# Patient Record
Sex: Female | Born: 1980 | Race: White | Hispanic: No | Smoking: Current some day smoker
Health system: Southern US, Community
[De-identification: ages and names within clinical notes are randomized; demographics above are authoritative.]

## PROBLEM LIST (undated history)

## (undated) ENCOUNTER — Ambulatory Visit: Admission: EM | Payer: BC Managed Care – PPO | Source: Home / Self Care

## (undated) DIAGNOSIS — L509 Urticaria, unspecified: Secondary | ICD-10-CM

## (undated) DIAGNOSIS — J45909 Unspecified asthma, uncomplicated: Secondary | ICD-10-CM

## (undated) DIAGNOSIS — R102 Pelvic and perineal pain: Secondary | ICD-10-CM

## (undated) DIAGNOSIS — Z8742 Personal history of other diseases of the female genital tract: Secondary | ICD-10-CM

## (undated) DIAGNOSIS — Z8489 Family history of other specified conditions: Secondary | ICD-10-CM

## (undated) DIAGNOSIS — D649 Anemia, unspecified: Secondary | ICD-10-CM

## (undated) DIAGNOSIS — E119 Type 2 diabetes mellitus without complications: Secondary | ICD-10-CM

## (undated) DIAGNOSIS — I10 Essential (primary) hypertension: Secondary | ICD-10-CM

## (undated) HISTORY — PX: CARPAL TUNNEL RELEASE: SHX101

---

## 2000-02-25 ENCOUNTER — Encounter: Admission: RE | Admit: 2000-02-25 | Discharge: 2000-02-25 | Payer: Self-pay | Admitting: Neurosurgery

## 2000-02-25 ENCOUNTER — Encounter: Payer: Self-pay | Admitting: Neurosurgery

## 2001-09-02 ENCOUNTER — Inpatient Hospital Stay (HOSPITAL_COMMUNITY): Admission: EM | Admit: 2001-09-02 | Discharge: 2001-09-06 | Payer: Self-pay | Admitting: Psychiatry

## 2002-09-20 ENCOUNTER — Other Ambulatory Visit: Admission: RE | Admit: 2002-09-20 | Discharge: 2002-09-20 | Payer: Self-pay | Admitting: Obstetrics & Gynecology

## 2002-11-26 ENCOUNTER — Inpatient Hospital Stay (HOSPITAL_COMMUNITY): Admission: AD | Admit: 2002-11-26 | Discharge: 2002-11-26 | Payer: Self-pay | Admitting: Obstetrics and Gynecology

## 2003-01-09 ENCOUNTER — Ambulatory Visit (HOSPITAL_COMMUNITY): Admission: RE | Admit: 2003-01-09 | Discharge: 2003-01-09 | Payer: Self-pay | Admitting: Obstetrics & Gynecology

## 2003-03-26 ENCOUNTER — Inpatient Hospital Stay (HOSPITAL_COMMUNITY): Admission: AD | Admit: 2003-03-26 | Discharge: 2003-03-28 | Payer: Self-pay | Admitting: Obstetrics & Gynecology

## 2003-05-01 ENCOUNTER — Other Ambulatory Visit: Admission: RE | Admit: 2003-05-01 | Discharge: 2003-05-01 | Payer: Self-pay | Admitting: Family Medicine

## 2004-04-30 ENCOUNTER — Other Ambulatory Visit: Admission: RE | Admit: 2004-04-30 | Discharge: 2004-04-30 | Payer: Self-pay | Admitting: Gynecology

## 2004-06-08 ENCOUNTER — Emergency Department (HOSPITAL_COMMUNITY): Admission: EM | Admit: 2004-06-08 | Discharge: 2004-06-08 | Payer: Self-pay | Admitting: Emergency Medicine

## 2008-03-16 HISTORY — PX: TUBAL LIGATION: SHX77

## 2010-10-09 NOTE — Consult Note (Signed)
Brianna Berger, Brianna Berger               ACCOUNT NO.:  [id number]  MEDICAL RECORD NO.:  [id number]  LOCATION:                                 FACILITY:  PHYSICIAN:  Barbaraann Barthel, M.D. DATE OF BIRTH:  01/16/1981  DATE OF CONSULTATION: DATE OF DISCHARGE:                                CONSULTATION   This patient was self referred.  She had been seen in [facility] in the emergency room for abdominal pain and was diagnosed there as having gallstones.  She has family living in [place] and she was self- referred to my office.  She was worked up in the emergency room at which time she was noted to have on sonogram multiple stones on the sonography and a CT scan of the abdomen which did not reveal any other findings. Her liver function studies were within normal limits.  At that time, her BUN and creatinine were also not elevated.  Her glucose was normal.  Her white count was 8.5 with an H and H of 12.6 and 36.4, normal platelet count, and a normal differential.  She was being treated afterwards for urinary tract infection as well.  CHIEF COMPLAINT:  The patient has had right upper quadrant pain.  It has been postprandial in nature and radiating to her back accompanied with nausea and at times with vomiting.  She has had multiple episodes of this, particularly after the birth of her last child 2 years ago, but she has had pain and this is ongoing for 2-1/2 to 3 years.  PAST MEDICAL HISTORY:  Otherwise noncontributory.  She is morbidly obese for her size.  PAST SURGICAL HISTORY:  In 2010 has been a tubal ligation.  No other past surgery other than tubal ligation.  She has no known allergies.  MEDICATIONS: 1. Promethazine 25 mg as needed for nausea. 2. Cipro 500 mg b.i.d. for her urinary tract infection. 3. Oxycodone and acetaminophen 7.5/325 mg as needed for pain.  She is a nondrinker and nonsmoker.  PHYSICAL EXAMINATION:  VITAL SIGNS:  She is 5 feet 3 inches, weighs 225 pounds,  her temperature is 98.5, her pulse rate is 96, respirations 12, and blood pressure 120/78. HEENT:  Head is normocephalic.  Eyes:  Extraocular movements are intact. Pupils were round and react to light and accommodation.  Nose and oral mucosa are moist. NECK:  Short without any jugular vein distention, thyromegaly, tracheal deviation, or the presence of any bruits or adenopathy. CHEST:  Heart is regular rhythm without murmurs. LUNGS:  Clear. BREASTS:  Pendulous without masses in either breasts or nipple discharge or axillary adenopathy. ABDOMEN:  The patient is tender on deep palpation in right upper quadrant.  No guarding, no rebound at present.  No CV tenderness noted. Bowel sounds are normoactive.  No femoral or inguinal hernias appreciated.  The patient is presently having her menses, a rectal examination was thus deferred. PELVIC:  Deferred. EXTREMITIES:  Within normal limits.  REVIEW OF SYSTEMS:  NEUROLOGIC:  No history of migraines or seizures. ENDOCRINE:  No history of diabetes or thyroid disease.  CARDIOPULMONARY: Negative.  MUSCULOSKELETAL:  Obesity.  OB/GYN:  She is a gravida 4, para 3, cesarean  0, abortus 1.  The patient has had a tubal ligation in 2010 and no family history of carcinoma of the breast.  GI:  Right upper quadrant pain radiating to her back with nausea and vomiting episodically over the last 2-1/2 to 3 years.  No history of constipation, diarrhea, bright red rectal bleeding, black tarry stools, history of inflammatory bowel disease, or unexplained weight loss.  She has never had a colonoscopy.  GU:  Presently being treated for urinary tract infection.  She has had no past history of kidney stones.  IMPRESSION: 1. Cholecystitis, cholelithiasis. 2. Obesity. 3. Resolving urinary tract infection.  PLAN:  This patient will be admitted via the outpatient department. Antibiotics will be given perioperatively and we will plan on surgery for her via the  outpatient department on this coming Monday which is July 30.     Barbaraann Barthel, M.D.     WB/MEDQ  D:  10/08/2010  T:  10/09/2010  Job:  [id number]  cc:   Outpatient Surgery Department [facility]

## 2010-10-10 ENCOUNTER — Other Ambulatory Visit (HOSPITAL_COMMUNITY): Payer: Self-pay

## 2010-10-14 ENCOUNTER — Encounter (HOSPITAL_COMMUNITY)
Admission: RE | Admit: 2010-10-14 | Discharge: 2010-10-14 | Disposition: A | Discharge status: Home / Self Care | Payer: Medicaid Other | Source: Ambulatory Visit | Attending: General Surgery | Admitting: General Surgery

## 2010-10-14 ENCOUNTER — Encounter (HOSPITAL_COMMUNITY): Payer: Self-pay

## 2010-10-14 HISTORY — DX: Urticaria, unspecified: L50.9

## 2010-10-14 LAB — SURGICAL PCR SCREEN: Staphylococcus aureus: NEGATIVE

## 2010-10-14 MED ORDER — LACTATED RINGERS IV SOLN: INTRAVENOUS | Status: DC

## 2010-10-14 NOTE — Patient Instructions (Addendum)
20 Brianna Berger  10/14/2010   Your procedure is scheduled on:  Monday, 10/20/10  Report to [facility] at 08:45 AM.  Call this number if you have problems the morning of surgery: [phone]   Remember:   Do not eat food:After Midnight.  Do not drink clear liquids: After Midnight.  Take these medicines the morning of surgery with A SIP OF WATER: phenergan and percocet if needed   Do not wear jewelry, make-up or nail polish.  Do not wear lotions, powders, or perfumes. You may wear deodorant.  Do not shave 48 hours prior to surgery.  Do not bring valuables to the hospital.  Contacts, dentures or bridgework may not be worn into surgery.  Leave suitcase in the car. After surgery it may be brought to your room.  For patients admitted to the hospital, checkout time is 11:00 AM the day of discharge.   Patients discharged the day of surgery will not be allowed to drive home.  Name and phone number of your driver: driver  Special Instructions: CHG Shower Use Special Wash: 1/2 bottle night before surgery and 1/2 bottle morning of surgery.   Please read over the following fact sheets that you were given: Pain Booklet, Coughing and Deep Breathing, MRSA Information, Surgical Site Infection Prevention, Anesthesia Post-op Instructions and Care and Recovery After Surgery   PATIENT INSTRUCTIONS POST-ANESTHESIA  IMMEDIATELY FOLLOWING SURGERY:  Do not drive or operate machinery for the first twenty four hours after surgery.  Do not make any important decisions for twenty four hours after surgery or while taking narcotic pain medications or sedatives.  If you develop intractable nausea and vomiting or a severe headache please notify your doctor immediately.  FOLLOW-UP:  Please make an appointment with your surgeon as instructed. You do not need to follow up with anesthesia unless specifically instructed to do so.  WOUND CARE INSTRUCTIONS (if applicable):  Keep a dry clean dressing on the  anesthesia/puncture wound site if there is drainage.  Once the wound has quit draining you may leave it open to air.  Generally you should leave the bandage intact for twenty four hours unless there is drainage.  If the epidural site drains for more than 36-48 hours please call the anesthesia department.  QUESTIONS?:  Please feel free to call your physician or the hospital operator if you have any questions, and they will be happy to assist you.     General Instructions for Surgery These instructions are generic. They cover multiple surgeries and are a general pre-surgical guideline. They do not apply to all procedures. You may have questions. Answers will be provided by your caregiver. PREPARING FOR SURGERY  Stop smoking at least two weeks prior to surgery. This lowers risk during surgery. Ask your caregiver for help with this if needed. The benefits are well worth it. This is a good time for a healthy lifestyle change.   Your caregiver may advise that you stop taking certain medications that may affect the outcome of the surgery and your ability to heal. For example, you may need to stop taking anti-inflammatories, such as aspirin or ibuprofen because of possible bleeding problems. Other medications may have interactions with anesthesia.   BE SURE TO LET YOUR CAREGIVER KNOW IF YOU HAVE BEEN ON STEROIDS FOR LONG PERIODS OF TIME. THIS IS CRITICAL.   Your caregiver will discuss possible risks and complications with you before surgery. In addition to the usual risks of anesthesia, other common risks and complications include  blood loss and replacement (not applicable to minor surgical procedures), temporary increase in pain due to surgery, uncorrected pain or problems the surgery was meant to correct, infection, or new damage.  BEFORE SURGERY Arrive  prior to your procedure or as your caregiver suggests. Check in at the admissions desk to fill out necessary forms if you are not pre-registered. There  will be consent forms to sign prior to the procedure. There is a waiting area for your family while you are having your procedure.  LET YOUR CAREGIVERS KNOW ABOUT THE FOLLOWING:  Allergies.  Medications taken including herbs, eye drops, over the counter medications, and creams.   Use of steroids (by mouth or creams).   Previous problems with anesthetics or novocaine.   Possibility of pregnancy, if this applies.  History of blood clots (thrombophlebitis).   History of bleeding or blood problems.   Previous surgery.   Other health problems.   FOLLOWING SURGERY You will be taken to the recovery area where a nurse will watch and check your progress. Once you are awake, stable, and taking fluids well, barring other problems you will be allowed to go home. Once home, an ice pack wrapped in a light towel applied to your operative site may help with discomfort and keep the swelling down. Follow instructions as suggested by your caregiver. HOME CARE INSTRUCTIONS  Follow your caregiver's instructions as to activities, exercises, physical therapy, or driving a car.   Weight reduction may be helpful depending upon the type of surgery.   Daily exercise is helpful. Maintain strength and range of motion as instructed.   Only take over-the-counter or prescription medicines for pain, discomfort, or fever as directed by your caregiver.  SEEK MEDICAL CARE IF:  You notice increased bleeding (more than a small spot) from the wound.   You soak more than four pads following a uterine procedure unless instructed otherwise.   There is redness, swelling, or increasing pain in the wound.   You notice pus coming from wound.   An unexplained oral temperature over 101.4 develops, or as your caregiver suggests.   You notice a foul smell coming from the wound or dressing.   You become light headed or if you pass out (faint).  SEEK IMMEDIATE MEDICAL CARE IF:  You develop a rash.   You have difficulty  breathing.   You develop any allergic problems.  Document Released: 06/08/2000 Document Re-Released: 05/29/2008 [facility] Patient Information 2011 [place], [place].

## 2010-10-14 NOTE — Pre-Procedure Instructions (Signed)
Lab work from [facility] ED reviewed by Dr. Jayme Cloud. No new blood work needed preop. Urine HCG needed in preop.

## 2010-10-20 ENCOUNTER — Ambulatory Visit (HOSPITAL_COMMUNITY)
Admission: RE | Admit: 2010-10-20 | Discharge: 2010-10-21 | Disposition: A | Discharge status: Home / Self Care | Payer: Medicaid Other | Source: Ambulatory Visit | Attending: General Surgery | Admitting: General Surgery

## 2010-10-20 ENCOUNTER — Encounter (HOSPITAL_COMMUNITY): Payer: Self-pay | Admitting: Anesthesiology

## 2010-10-20 ENCOUNTER — Other Ambulatory Visit: Payer: Self-pay | Admitting: General Surgery

## 2010-10-20 ENCOUNTER — Encounter (HOSPITAL_COMMUNITY)
Admission: RE | Disposition: A | Discharge status: Home / Self Care | Payer: Self-pay | Source: Ambulatory Visit | Attending: General Surgery

## 2010-10-20 ENCOUNTER — Ambulatory Visit (HOSPITAL_COMMUNITY): Payer: Medicaid Other | Admitting: Anesthesiology

## 2010-10-20 ENCOUNTER — Encounter (HOSPITAL_COMMUNITY): Payer: Self-pay

## 2010-10-20 DIAGNOSIS — K8010 Calculus of gallbladder with chronic cholecystitis without obstruction: Secondary | ICD-10-CM | POA: Insufficient documentation

## 2010-10-20 DIAGNOSIS — Z01812 Encounter for preprocedural laboratory examination: Secondary | ICD-10-CM | POA: Insufficient documentation

## 2010-10-20 HISTORY — PX: CHOLECYSTECTOMY: SHX55

## 2010-10-20 SURGERY — LAPAROSCOPIC CHOLECYSTECTOMY
Anesthesia: General | Wound class: Clean Contaminated

## 2010-10-20 MED ORDER — ONDANSETRON HCL 4 MG/2ML IJ SOLN
INTRAMUSCULAR | Status: AC
  Administered 2010-10-20: 4 mg via INTRAVENOUS
  Filled 2010-10-20: qty 2

## 2010-10-20 MED ORDER — ONDANSETRON HCL 4 MG/2ML IJ SOLN
4.0000 mg | Freq: Four times a day (QID) | INTRAMUSCULAR | Status: DC | PRN
  Administered 2010-10-20: 4 mg via INTRAVENOUS
  Filled 2010-10-20: qty 2

## 2010-10-20 MED ORDER — LACTATED RINGERS IV SOLN
INTRAVENOUS | Status: DC
  Administered 2010-10-20 (×3): via INTRAVENOUS

## 2010-10-20 MED ORDER — FENTANYL CITRATE 0.05 MG/ML IJ SOLN
25.0000 ug | INTRAMUSCULAR | Status: DC | PRN
  Administered 2010-10-20 (×2): 50 ug via INTRAVENOUS

## 2010-10-20 MED ORDER — GLYCOPYRROLATE 0.2 MG/ML IJ SOLN
0.2000 mg | Freq: Once | INTRAMUSCULAR | Status: AC
  Administered 2010-10-20: 0.2 mg via INTRAVENOUS

## 2010-10-20 MED ORDER — BUPIVACAINE HCL (PF) 0.5 % IJ SOLN
INTRAMUSCULAR | Status: DC | PRN
  Administered 2010-10-20: 10 mL

## 2010-10-20 MED ORDER — ACETAMINOPHEN 325 MG PO TABS: 650.0000 mg | ORAL_TABLET | ORAL | Status: DC | PRN

## 2010-10-20 MED ORDER — NEOSTIGMINE METHYLSULFATE 1 MG/ML IJ SOLN
INTRAMUSCULAR | Status: DC | PRN
  Administered 2010-10-20: 2 mg via INTRAMUSCULAR

## 2010-10-20 MED ORDER — ROCURONIUM BROMIDE 100 MG/10ML IV SOLN
INTRAVENOUS | Status: DC | PRN
  Administered 2010-10-20: 35 mg via INTRAVENOUS
  Administered 2010-10-20: 5 mg via INTRAVENOUS

## 2010-10-20 MED ORDER — ROCURONIUM BROMIDE 50 MG/5ML IV SOLN
INTRAVENOUS | Status: AC
  Filled 2010-10-20: qty 1

## 2010-10-20 MED ORDER — GLYCOPYRROLATE 0.2 MG/ML IJ SOLN
INTRAMUSCULAR | Status: AC
  Filled 2010-10-20: qty 1

## 2010-10-20 MED ORDER — LIDOCAINE HCL (PF) 1 % IJ SOLN
INTRAMUSCULAR | Status: AC
  Filled 2010-10-20: qty 5

## 2010-10-20 MED ORDER — NEOSTIGMINE METHYLSULFATE 1 MG/ML IJ SOLN
INTRAMUSCULAR | Status: AC
  Filled 2010-10-20: qty 10

## 2010-10-20 MED ORDER — WATER FOR IRRIGATION, STERILE IR SOLN
Status: DC | PRN
  Administered 2010-10-20: 2000 mL

## 2010-10-20 MED ORDER — MORPHINE SULFATE 2 MG/ML IJ SOLN
1.0000 mg | INTRAMUSCULAR | Status: DC | PRN
  Administered 2010-10-20 – 2010-10-21 (×5): 1 mg via INTRAVENOUS
  Filled 2010-10-20 (×5): qty 1

## 2010-10-20 MED ORDER — ONDANSETRON HCL 4 MG/2ML IJ SOLN
4.0000 mg | Freq: Once | INTRAMUSCULAR | Status: AC
  Administered 2010-10-20: 4 mg via INTRAVENOUS

## 2010-10-20 MED ORDER — POTASSIUM CHLORIDE IN NACL 20-0.9 MEQ/L-% IV SOLN
INTRAVENOUS | Status: DC
  Administered 2010-10-20 – 2010-10-21 (×3): via INTRAVENOUS

## 2010-10-20 MED ORDER — HEMOSTATIC AGENTS (NO CHARGE) OPTIME
TOPICAL | Status: DC | PRN
  Administered 2010-10-20: 1 via TOPICAL

## 2010-10-20 MED ORDER — LORATADINE 10 MG PO TABS
10.0000 mg | ORAL_TABLET | Freq: Every day | ORAL | Status: DC
  Administered 2010-10-21: 10 mg via ORAL
  Filled 2010-10-20: qty 1

## 2010-10-20 MED ORDER — BUPIVACAINE HCL (PF) 0.5 % IJ SOLN
INTRAMUSCULAR | Status: AC
  Filled 2010-10-20: qty 30

## 2010-10-20 MED ORDER — FENTANYL CITRATE 0.05 MG/ML IJ SOLN
INTRAMUSCULAR | Status: AC
  Administered 2010-10-20: 50 ug via INTRAVENOUS
  Filled 2010-10-20: qty 2

## 2010-10-20 MED ORDER — NON FORMULARY: 10.0000 mg | Status: DC

## 2010-10-20 MED ORDER — DEXTROSE 5 % IV SOLN
INTRAVENOUS | Status: AC
  Filled 2010-10-20: qty 1

## 2010-10-20 MED ORDER — NALOXONE HCL 0.4 MG/ML IJ SOLN
INTRAMUSCULAR | Status: AC
  Filled 2010-10-20: qty 1

## 2010-10-20 MED ORDER — SODIUM CHLORIDE 0.9 % IV SOLN
INTRAVENOUS | Status: DC
  Filled 2010-10-20 (×6): qty 1000

## 2010-10-20 MED ORDER — FENTANYL CITRATE 0.05 MG/ML IJ SOLN
INTRAMUSCULAR | Status: DC | PRN
  Administered 2010-10-20 (×5): 50 ug via INTRAVENOUS

## 2010-10-20 MED ORDER — DEXTROSE 5 % IV SOLN
1.0000 g | Freq: Once | INTRAVENOUS | Status: AC
  Administered 2010-10-20: 1 g via INTRAVENOUS

## 2010-10-20 MED ORDER — GLYCOPYRROLATE 0.2 MG/ML IJ SOLN
INTRAMUSCULAR | Status: AC
  Administered 2010-10-20: 0.2 mg via INTRAVENOUS
  Filled 2010-10-20: qty 1

## 2010-10-20 MED ORDER — ONDANSETRON HCL 4 MG/2ML IJ SOLN
4.0000 mg | Freq: Once | INTRAMUSCULAR | Status: AC | PRN
  Administered 2010-10-20: 4 mg via INTRAVENOUS

## 2010-10-20 MED ORDER — GLYCOPYRROLATE 0.2 MG/ML IJ SOLN
INTRAMUSCULAR | Status: DC | PRN
  Administered 2010-10-20: .2 mg via INTRAVENOUS
  Administered 2010-10-20: .4 mg via INTRAVENOUS

## 2010-10-20 MED ORDER — PROPOFOL 10 MG/ML IV EMUL
INTRAVENOUS | Status: AC
  Filled 2010-10-20: qty 20

## 2010-10-20 MED ORDER — FENTANYL CITRATE 0.05 MG/ML IJ SOLN
INTRAMUSCULAR | Status: AC
  Administered 2010-10-20: 50 ug via INTRAVENOUS
  Filled 2010-10-20: qty 5

## 2010-10-20 MED ORDER — MIDAZOLAM HCL 2 MG/2ML IJ SOLN
1.0000 mg | INTRAMUSCULAR | Status: DC | PRN
  Administered 2010-10-20: 2 mg via INTRAVENOUS

## 2010-10-20 MED ORDER — PROPOFOL 10 MG/ML IV EMUL
INTRAVENOUS | Status: DC | PRN
  Administered 2010-10-20: 150 mg via INTRAVENOUS
  Administered 2010-10-20: 50 mg via INTRAVENOUS

## 2010-10-20 MED ORDER — LORAZEPAM 1 MG PO TABS
1.0000 mg | ORAL_TABLET | Freq: Every day | ORAL | Status: DC
  Administered 2010-10-20: 1 mg via ORAL
  Filled 2010-10-20: qty 1

## 2010-10-20 MED ORDER — SODIUM CHLORIDE 0.9 % IR SOLN
Status: DC | PRN
  Administered 2010-10-20: 1000 mL

## 2010-10-20 MED ORDER — MIDAZOLAM HCL 2 MG/2ML IJ SOLN
INTRAMUSCULAR | Status: AC
  Administered 2010-10-20: 2 mg via INTRAVENOUS
  Filled 2010-10-20: qty 2

## 2010-10-20 MED ORDER — LACTATED RINGERS IV SOLN
INTRAVENOUS | Status: AC
  Filled 2010-10-20: qty 1000

## 2010-10-20 MED ORDER — SODIUM CHLORIDE 0.9 % IR SOLN
Status: DC | PRN
  Administered 2010-10-20: 3000 mL

## 2010-10-20 MED FILL — Water For Irrigation, Sterile Irrigation Soln: Qty: 2000 | Status: AC

## 2010-10-20 SURGICAL SUPPLY — 69 items
APPLICATOR COTTON TIP 6IN STRL (MISCELLANEOUS) ×2 IMPLANT
APPLIER CLIP ROT 10 11.4 M/L (STAPLE) ×2
APR CLP MED LRG 11.4X10 (STAPLE) ×1
ATTRACTOMAT 16X20 MAGNETIC DRP (DRAPES) IMPLANT
BAG HAMPER (MISCELLANEOUS) ×2 IMPLANT
BAG SPEC RTRVL LRG 6X4 10 (ENDOMECHANICALS) ×1
BLADE SURG 15 STRL LF DISP TIS (BLADE) ×1 IMPLANT
BLADE SURG 15 STRL SS (BLADE) ×2
BLADE SURG SZ10 CARB STEEL (BLADE) IMPLANT
CLIP APPLIE ROT 10 11.4 M/L (STAPLE) ×1 IMPLANT
CLOTH BEACON ORANGE TIMEOUT ST (SAFETY) ×2 IMPLANT
COVER LIGHT HANDLE STERIS (MISCELLANEOUS) ×5 IMPLANT
DECANTER SPIKE VIAL GLASS SM (MISCELLANEOUS) ×2 IMPLANT
DISSECTOR BLUNT TIP ENDO 5MM (MISCELLANEOUS) ×3 IMPLANT
DRAPE WARM FLUID 44X44 (DRAPE) IMPLANT
DRSG TEGADERM 2-3/8X2-3/4 SM (GAUZE/BANDAGES/DRESSINGS) ×6 IMPLANT
DRSG TEGADERM 4X4.75 (GAUZE/BANDAGES/DRESSINGS) ×2 IMPLANT
ELECT BLADE 6 FLAT ULTRCLN (ELECTRODE) IMPLANT
ELECT REM PT RETURN 9FT ADLT (ELECTROSURGICAL) ×2
ELECTRODE REM PT RTRN 9FT ADLT (ELECTROSURGICAL) ×1 IMPLANT
EVACUATOR DRAINAGE 10X20 100CC (DRAIN) ×1 IMPLANT
EVACUATOR SILICONE 100CC (DRAIN) ×2
FILTER SMOKE EVAC LAPAROSHD (FILTER) ×2 IMPLANT
FORMALIN 10 PREFIL 120ML (MISCELLANEOUS) ×2 IMPLANT
GAUZE SPONGE 4X4 12PLY STRL LF (GAUZE/BANDAGES/DRESSINGS) ×1 IMPLANT
GLOVE BIOGEL PI IND STRL 7.0 (GLOVE) IMPLANT
GLOVE BIOGEL PI INDICATOR 7.0 (GLOVE) ×4
GLOVE ECLIPSE 6.5 STRL STRAW (GLOVE) ×2 IMPLANT
GLOVE ECLIPSE 7.0 STRL STRAW (GLOVE) ×1 IMPLANT
GLOVE SKINSENSE NS SZ7.0 (GLOVE) ×1
GLOVE SKINSENSE STRL SZ7.0 (GLOVE) ×1 IMPLANT
GOWN BRE IMP SLV AUR XL STRL (GOWN DISPOSABLE) ×4 IMPLANT
HEMOSTAT SNOW SURGICEL 2X4 (HEMOSTASIS) ×1 IMPLANT
HEMOSTAT SURGICEL 4X8 (HEMOSTASIS) ×2 IMPLANT
INST SET LAPROSCOPIC AP (KITS) ×2 IMPLANT
IV NS IRRIG 3000ML ARTHROMATIC (IV SOLUTION) ×2 IMPLANT
KIT ROOM TURNOVER APOR (KITS) ×2 IMPLANT
KIT TROCAR LAP CHOLE (TROCAR) ×2 IMPLANT
MANIFOLD NEPTUNE II (INSTRUMENTS) ×2 IMPLANT
MARKER SKIN DUAL TIP RULER LAB (MISCELLANEOUS) ×1 IMPLANT
NS IRRIG 1000ML POUR BTL (IV SOLUTION) ×2 IMPLANT
PACK LAP CHOLE LZT030E (CUSTOM PROCEDURE TRAY) ×2 IMPLANT
PAD ARMBOARD 7.5X6 YLW CONV (MISCELLANEOUS) ×2 IMPLANT
PENCIL HANDSWITCHING (ELECTRODE) IMPLANT
POUCH SPECIMEN RETRIEVAL 10MM (ENDOMECHANICALS) ×2 IMPLANT
SET BASIN LINEN APH (SET/KITS/TRAYS/PACK) ×2 IMPLANT
SET TUBE IRRIG SUCTION NO TIP (IRRIGATION / IRRIGATOR) ×2 IMPLANT
SOL PREP PROV IODINE SCRUB 4OZ (MISCELLANEOUS) ×2 IMPLANT
SPONGE DRAIN TRACH 4X4 STRL 2S (GAUZE/BANDAGES/DRESSINGS) ×2 IMPLANT
SPONGE GAUZE 4X4 12PLY (GAUZE/BANDAGES/DRESSINGS) ×2 IMPLANT
SPONGE INTESTINAL PEANUT (DISPOSABLE) IMPLANT
SPONGE LAP 18X18 X RAY DECT (DISPOSABLE) IMPLANT
STAPLER VISISTAT 35W (STAPLE) ×2 IMPLANT
SUT ETHILON 3 0 FSL (SUTURE) ×2 IMPLANT
SUT SILK 2 0 (SUTURE)
SUT SILK 2 0 SH (SUTURE) IMPLANT
SUT SILK 2-0 18XBRD TIE 12 (SUTURE) IMPLANT
SUT SILK 3 0 SH CR/8 (SUTURE) IMPLANT
SUT VIC AB 0 CT1 27 (SUTURE)
SUT VIC AB 0 CT1 27XBRD ANTBC (SUTURE) IMPLANT
SUT VIC AB 0 CT1 27XCR 8 STRN (SUTURE) IMPLANT
SUT VICRYL 0 UR6 27IN ABS (SUTURE) ×2 IMPLANT
SYR BULB IRRIGATION 50ML (SYRINGE) IMPLANT
TAPE CLOTH SURG 4X10 WHT LF (GAUZE/BANDAGES/DRESSINGS) ×1 IMPLANT
TOWEL OR 17X26 4PK STRL BLUE (TOWEL DISPOSABLE) ×2 IMPLANT
TRAY FOLEY CATH 14FR (SET/KITS/TRAYS/PACK) ×2 IMPLANT
WARMER LAPAROSCOPE (MISCELLANEOUS) ×2 IMPLANT
WATER STERILE IRR 1000ML POUR (IV SOLUTION) ×4 IMPLANT
YANKAUER SUCT BULB TIP 10FT TU (MISCELLANEOUS) IMPLANT

## 2010-10-20 NOTE — Anesthesia Postprocedure Evaluation (Signed)
  Anesthesia Post-op Note  Patient: Brianna Berger  Procedure(s) Performed:  LAPAROSCOPIC CHOLECYSTECTOMY  Patient Location: PACU  Anesthesia Type: General  Level of Consciousness: alert   Airway and Oxygen Therapy: Patient Spontanous Breathing  Post-op Pain: mild  Post-op Assessment: Patient's Cardiovascular Status Stable, Respiratory Function Stable, Patent Airway and No signs of Nausea or vomiting  Post-op Vital Signs: stable  Complications: No apparent anesthesia complications

## 2010-10-20 NOTE — Progress Notes (Signed)
  No change in H&P.  For surgery, 10/20/10.

## 2010-10-20 NOTE — Brief Op Note (Addendum)
10/20/2010  1:17 PM  PATIENT:  Brianna Berger  30 y.o. female  PRE-OPERATIVE DIAGNOSIS:  cholelithiasis,cholecystitis  POST-OPERATIVE DIAGNOSIS:  cholelithiasis, cholecystitis  PROCEDURE:  Procedure(s): LAPAROSCOPIC CHOLECYSTECTOMY  SURGEON:  Surgeon(s): Marlane Hatcher  PHYSICIAN ASSISTANT:   ASSISTANTS: none   ANESTHESIA:   general  ESTIMATED BLOOD LOSS: * No blood loss amount entered *   BLOOD ADMINISTERED:none  DRAINS: (liver bed.) Jackson-Pratt drain(s) with closed bulb suction in the liver bed   LOCAL MEDICATIONS USED:  MARCAINE 9CC  SPECIMEN:  No Specimen  DISPOSITION OF SPECIMEN:  N/A  COUNTS:  YES  TOURNIQUET:  * No tourniquets in log *  DICTATION #:xxx  PLAN OF CARE: obs status   PATIENT DISPOSITION:  PACU - hemodynamically stable.   Delay start of Pharmacological VTE agent (>24hrs) due to surgical blood loss or risk of bleeding:  {no               Specimen:  Gall bladder and stones Dictation #:  [id number]

## 2010-10-20 NOTE — Anesthesia Procedure Notes (Addendum)
Procedure Name: Intubation Date/Time: 10/20/2010 11:56 AM Performed by: Minerva Areola Pre-anesthesia Checklist: Patient identified, Patient being monitored, Timeout performed, Emergency Drugs available and Suction available Patient Re-evaluated:Patient Re-evaluated prior to inductionOxygen Delivery Method: Circle System Utilized Preoxygenation: Pre-oxygenation with 100% oxygen Intubation Type: IV induction Ventilation: Mask ventilation without difficulty Laryngoscope Size: Miller and 2 Grade View: Grade I Tube type: Oral Tube size: 7.0 mm Number of attempts: 1 Airway Equipment and Method: stylet Placement Confirmation: ETT inserted through vocal cords under direct vision,  positive ETCO2 and breath sounds checked- equal and bilateral Secured at: 21 cm Tube secured with: Tape Dental Injury: Teeth and Oropharynx as per pre-operative assessment

## 2010-10-20 NOTE — Transfer of Care (Signed)
Immediate Anesthesia Transfer of Care Note  Patient: Brianna Berger  Procedure(s) Performed:  LAPAROSCOPIC CHOLECYSTECTOMY  Patient Location: PACU  Anesthesia Type: General  Level of Consciousness: awake and patient cooperative  Airway & Oxygen Therapy: Patient Spontanous Breathing and non-rebreather face mask  Post-op Assessment: Report given to PACU RN, Post -op Vital signs reviewed and stable and Patient moving all extremities  Post vital signs: Reviewed and stable  Complications: No apparent anesthesia complications

## 2010-10-20 NOTE — Brief Op Note (Signed)
10/20/2010  1:02 PM  PATIENT:  Brianna Berger  30 y.o. female  PRE-OPERATIVE DIAGNOSIS:  cholelithiasis,cholecystitis  POST-OPERATIVE DIAGNOSIS:  cholelithiasis, cholecystitis  PROCEDURE:  Procedure(s): LAPAROSCOPIC CHOLECYSTECTOMY  SURGEON:  Surgeon(s): Marlane Hatcher  PHYSICIAN ASSISTANT:   ASSISTANTS: none   ANESTHESIA:   general  ESTIMATED BLOOD LOSS: * No blood loss amount entered *   BLOOD ADMINISTERED:none  DRAINS: () Jackson-Pratt drain(s) with closed bulb suction in the  liver bed   LOCAL MEDICATIONS USED:  OTHER marcaine  SPECIMEN:  Source of Specimen:  gall bladder and stones  DISPOSITION OF SPECIMEN:  PATHOLOGY  COUNTS:  YES  TOURNIQUET:  * No tourniquets in log *  DICTATION #: [id number]  PLAN OF CARE: obs  PATIENT DISPOSITION:  rm 316   Delay start of Pharmacological VTE agent (>24hrs) due to surgical blood loss or risk of bleeding:  no       Dict.#[id number]

## 2010-10-20 NOTE — Anesthesia Preprocedure Evaluation (Signed)
Anesthesia Evaluation  Name, MR# and DOB Patient awake  General Assessment Comment  Reviewed: Allergy & Precautions, H&P  and Patient's Chart, lab work & pertinent test results  History of Anesthesia Complications Negative for: history of anesthetic complications  Airway Mallampati: I  Neck ROM: Full    Dental  (+) Teeth Intact   Pulmonary    pulmonary exam normalPulmonary Exam Normal     Cardiovascular Regular     Neuro/Psych   GI/Hepatic/Renal   Endo/Other    Abdominal   Musculoskeletal   Hematology   Peds  Reproductive/Obstetrics    Anesthesia Other Findings             Anesthesia Physical Anesthesia Plan  ASA: I  Anesthesia Plan: General   Post-op Pain Management:    Induction: Intravenous  Airway Management Planned: Oral ETT  Additional Equipment:   Intra-op Plan:   Post-operative Plan:   Informed Consent: I have reviewed the patients History and Physical, chart, labs and discussed the procedure including the risks, benefits and alternatives for the proposed anesthesia with the patient or authorized representative who has indicated his/her understanding and acceptance.     Plan Discussed with:   Anesthesia Plan Comments:         Anesthesia Quick Evaluation

## 2010-10-21 LAB — HEPATIC FUNCTION PANEL
ALT: 34 U/L (ref 0–35)
Bilirubin, Direct: 0.1 mg/dL (ref 0.0–0.3)
Indirect Bilirubin: 0.3 mg/dL (ref 0.3–0.9)
Total Bilirubin: 0.4 mg/dL (ref 0.3–1.2)

## 2010-10-21 LAB — BASIC METABOLIC PANEL
BUN: 7 mg/dL (ref 6–23)
Chloride: 105 mEq/L (ref 96–112)
Creatinine, Ser: 0.6 mg/dL (ref 0.50–1.10)
GFR calc Af Amer: >60 mL/min (ref >60)
GFR calc non Af Amer: >60 mL/min (ref >60)

## 2010-10-21 LAB — CBC
HCT: 31.9 % — ABNORMAL LOW (ref 36.0–46.0)
MCHC: 33.5 g/dL (ref 30.0–36.0)
MCV: 90.1 fL (ref 78.0–100.0)
RDW: 13.4 % (ref 11.5–15.5)

## 2010-10-21 MED ORDER — ACETAMINOPHEN 325 MG PO TABS: 650.0000 mg | ORAL_TABLET | ORAL | Status: AC | PRN

## 2010-10-21 MED ORDER — OXYCODONE-ACETAMINOPHEN 7.5-325 MG PO TABS: 1.0000 | ORAL_TABLET | Freq: Four times a day (QID) | ORAL | Status: DC | PRN

## 2010-10-21 MED ORDER — POTASSIUM CHLORIDE IN NACL 20-0.9 MEQ/L-% IV SOLN
INTRAVENOUS | Status: DC
  Administered 2010-10-21: 12:00:00 via INTRAVENOUS

## 2010-10-21 NOTE — Op Note (Signed)
Brianna Berger, Brianna Berger               ACCOUNT NO.:  [id number]  MEDICAL RECORD NO.:  [id number]  LOCATION:  A316                          FACILITY:  [facility]  PHYSICIAN:  Barbaraann Barthel, M.D. DATE OF BIRTH:  14-Jun-1980  DATE OF PROCEDURE:  10/20/2010 DATE OF DISCHARGE:                              OPERATIVE REPORT   SURGEON:  Barbaraann Barthel, MD  PREOPERATIVE DIAGNOSIS:  Cholecystitis and cholelithiasis.  POSTOPERATIVE DIAGNOSIS:  Cholecystitis and cholelithiasis.  PROCEDURE:  Laparoscopic cholecystectomy.  SPECIMEN:  Gallbladder and stones.  WOUND CLASSIFICATION:  Clean, contaminated.  ESTIMATED BLOOD LOSS:  Minimal, less than 50 mL.  FLUIDS:  1400 mL of crystalloids intraoperatively.  No transfusions. One Jackson-Pratt drain in the liver bed.  Note, this is a 30 year old white female who had approximately 2-1/2-3- year history of nausea and vomiting with recurrent episodes of right upper quadrant pain.  She was worked up [employer] and found to have cholelithiasis.  She came to [facility] and self-referred to me for cholecystectomy.  We discussed the need for cholecystectomy, discussing complications not limited to but including bleeding, infection, damage to bile ducts, perforation of organs, transitory diarrhea and informed consent was obtained.  GROSS OPERATIVE FINDINGS:  The patient had a lot of intra-abdominal fat. There was small cystic duct, this was not cannulated.  There were stones within the gallbladder.  There were no other intraoperative findings.  TECHNIQUE:  The patient was placed in supine position and after the adequate administration of general anesthesia via endotracheal intubation, her entire abdomen was prepped with Betadine solution and draped in usual manner.  Prior to this, a Foley catheter was aseptically inserted.  An incision was carried out above the umbilicus transversely through the skin, subcutaneous tissue down to the fascia, which  was grasped with a sharp towel clip and elevated.  A Veress needle was inserted and confirmed the position with a saline drop test.  We then insufflated the abdomen with approximately 4.2 liters of CO2.  Then, using the Visiport technique, an 11-mm cannula was inserted and then under direct vision, another 11-mm cannula was placed in the epigastrium and then under direct vision two 5-mm cannula was placed in the right upper quadrant laterally.  The gallbladder was grasped.  Adhesions were taken down.  The cystic duct was clearly visualized, triply silver clipped and divided as was the cystic artery.  We then removed the gallbladder without spillage with the hook cautery device.  There was minimal oozing.  This was controlled with a cautery device and then after checking for bleeding and hemostasis, I elected to leave a Surgicel on the liver bed and the Jackson-Pratt drain in the liver bed, which exited through one of the lateral incision sites.  We then desufflated the abdomen and then I closed the larger incision sites in the epigastrium and the umbilicus with 0 Polysorb and used 0.5% Sensorcaine and all port sites were postoperatively comfort.  The wound was then closed with stapling device and the drain was sutured in place with 3-0 nylon.  Prior to closure, all sponge, needle, and instrument counts were found to be correct.  Estimated blood loss was minimal.  The patient tolerated the  procedure well and was taken to the recovery room in satisfactory condition.     Barbaraann Barthel, M.D.     WB/MEDQ  D:  10/20/2010  T:  10/21/2010  Job:  [id number]

## 2010-10-21 NOTE — Discharge Summary (Signed)
  Dic# [id number]

## 2010-10-21 NOTE — Anesthesia Postprocedure Evaluation (Signed)
  Anesthesia Post-op Note  Patient: Brianna Berger  Procedure(s) Performed:  LAPAROSCOPIC CHOLECYSTECTOMY  Patient Location: Room 316  Anesthesia Type: General  Level of Consciousness: awake, alert , oriented and patient cooperative  Airway and Oxygen Therapy: Patient Spontanous Breathing  Post-op Pain: 5 /10, moderate  Post-op Assessment: Post-op Vital signs reviewed, Patient's Cardiovascular Status Stable, Respiratory Function Stable, Patent Airway, No signs of Nausea or vomiting, Adequate PO intake and Pain level controlled  Post-op Vital Signs: stable  Complications: No apparent anesthesia complications

## 2010-10-21 NOTE — Progress Notes (Signed)
  POD#1:  Minimal discomfort, wound clean, dressing changed. Minimal serous JP drainage.  Have removed the drain.  No SOB, leg pain, or dysuria.  Labs reviewed, LFTs WNL.    CBC    Component Value Date/Time   WBC 8.7 10/21/2010 0507   RBC 3.54* 10/21/2010 0507   HGB 10.7* 10/21/2010 0507   HCT 31.9* 10/21/2010 0507   PLT 201 10/21/2010 0507   MCV 90.1 10/21/2010 0507   MCH 30.2 10/21/2010 0507   MCHC 33.5 10/21/2010 0507   RDW 13.4 10/21/2010 0507   Lab Results  Component Value Date   ALT 34 10/21/2010   AST 29 10/21/2010   ALKPHOS 54 10/21/2010   BILITOT 0.4 10/21/2010   Plan to discharge today.  Follow up arranged.

## 2010-10-21 NOTE — Progress Notes (Signed)
Pt d/c home via mother. Pt A&O x3, ambulatory, pain controlled and rated at 2/10 in her abdomen which pt states is her pain goal. Incision sites are clean, dry, and intact that were cleansed with alcohol recently by Dr. Malvin Johns. D/C instructions, d/c medications, f/u appts, and incision care were all reviewed with pt prior to pt. All questions were answered and pt verbalized understanding.

## 2010-10-22 NOTE — Discharge Summary (Signed)
[name], Brianna Berger               ACCOUNT NO.:  [id number]  MEDICAL RECORD NO.:  [id number]  LOCATION:  A316                          FACILITY:  [facility]  PHYSICIAN:  Barbaraann Barthel, M.D. DATE OF BIRTH:  10-14-1980  DATE OF ADMISSION:  10/20/2010 DATE OF DISCHARGE:  08/07/2012LH                              DISCHARGE SUMMARY   PROCEDURE:  Laparoscopic cholecystectomy on October 20, 2010.  DIAGNOSES:  Cholecystitis, cholelithiasis.  SECONDARY DIAGNOSIS:  Obesity.  NOTE:  This is a 30 year old white female who was admitted via the Outpatient Department for semielective laparoscopic cholecystectomy. She was seen previously for abdominal pain and had signs and symptoms of cholecystitis and cholelithiasis.  Her liver function studies were mildly elevated preoperatively.  However, postoperatively, the liver function studies were within the normal range.  She was admitted via the Outpatient Department for the procedure, which we had discussed in detail with her preoperatively and her hospital course was completely uneventful.  She underwent a laparoscopic cholecystectomy and she was discharged on the first postoperative day.  At that time, her wound was clean.  She was voiding well.  She had no dysuria, leg pain, or shortness of breath and was doing quite well.  Her Jackson-Pratt drain had minimal serosanguineous drainage and this was removed prior to her discharge.  Wound care was explained to her and she was told to follow up with [place] on the following Monday at 3 o'clock p.m. and she is to contact us should there be any acute changes.  Her discharge instructions were given and she was also ordered oxycodone/ Percocet 7.5/325 mg tablet 1 q.4-6 h. as needed for pain.  Again, she is told to contact us or go to the emergency room if there are problems.     Barbaraann Barthel, M.D.     WB/MEDQ  D:  10/21/2010  T:  10/22/2010  Job:  [id number]

## 2010-10-29 ENCOUNTER — Encounter (HOSPITAL_COMMUNITY): Payer: Self-pay | Admitting: General Surgery

## 2013-04-02 ENCOUNTER — Emergency Department (HOSPITAL_COMMUNITY)
Admission: EM | Admit: 2013-04-02 | Discharge: 2013-04-02 | Discharge status: Against Medical Advice | Payer: BC Managed Care – PPO | Attending: Emergency Medicine | Admitting: Emergency Medicine

## 2013-04-02 ENCOUNTER — Encounter (HOSPITAL_COMMUNITY): Payer: Self-pay | Admitting: Emergency Medicine

## 2013-04-02 DIAGNOSIS — R51 Headache: Secondary | ICD-10-CM | POA: Insufficient documentation

## 2013-04-02 DIAGNOSIS — F17200 Nicotine dependence, unspecified, uncomplicated: Secondary | ICD-10-CM | POA: Insufficient documentation

## 2013-04-02 DIAGNOSIS — R209 Unspecified disturbances of skin sensation: Secondary | ICD-10-CM | POA: Insufficient documentation

## 2013-04-02 DIAGNOSIS — R42 Dizziness and giddiness: Secondary | ICD-10-CM | POA: Insufficient documentation

## 2013-04-02 NOTE — ED Notes (Signed)
Pt states she is leaving.  Encouraged pt to stay again and she declines and states she is going home.

## 2013-04-02 NOTE — ED Notes (Signed)
Pt presents to department for evaluation of headache. States she was at home this morning and almost passed out. Also reports dizziness and numbness to R arm. 6/10 headache upon arrival to ED. No neurological deficits noted. Able to move all extremities. Strong equal bilateral grip strengths. Pt is alert and oriented x4. NAD.

## 2013-05-09 ENCOUNTER — Ambulatory Visit: Payer: BC Managed Care – PPO | Admitting: Adult Health

## 2013-05-11 ENCOUNTER — Ambulatory Visit: Payer: BC Managed Care – PPO | Admitting: Adult Health

## 2013-05-17 ENCOUNTER — Telehealth: Payer: Self-pay | Admitting: Adult Health

## 2013-05-17 NOTE — Telephone Encounter (Signed)
Spoke with pt she was at work and could not leave.  She will be here 3/9

## 2013-05-17 NOTE — Telephone Encounter (Signed)
Left message for pt to call office

## 2013-05-17 NOTE — Telephone Encounter (Signed)
Left message for pt to call office  See below note   Will you please try calling this patient to see if she can come in today where I have it blocked on raquel's schedule at 11:15 today but have her come in at 11 to fill out the paperwork.   Thanks  [employer]

## 2013-05-22 ENCOUNTER — Ambulatory Visit: Payer: BC Managed Care – PPO | Admitting: Adult Health

## 2014-09-25 ENCOUNTER — Other Ambulatory Visit: Payer: Self-pay | Admitting: Physician Assistant

## 2014-09-25 DIAGNOSIS — R945 Abnormal results of liver function studies: Principal | ICD-10-CM

## 2014-09-25 DIAGNOSIS — R7989 Other specified abnormal findings of blood chemistry: Secondary | ICD-10-CM

## 2014-09-28 ENCOUNTER — Ambulatory Visit
Admission: RE | Admit: 2014-09-28 | Discharge: 2014-09-28 | Disposition: A | Discharge status: Home / Self Care | Payer: BC Managed Care – PPO | Source: Ambulatory Visit | Attending: Physician Assistant | Admitting: Physician Assistant

## 2014-09-28 DIAGNOSIS — R7989 Other specified abnormal findings of blood chemistry: Secondary | ICD-10-CM | POA: Insufficient documentation

## 2014-09-28 DIAGNOSIS — R945 Abnormal results of liver function studies: Secondary | ICD-10-CM

## 2016-09-03 ENCOUNTER — Other Ambulatory Visit: Payer: Self-pay | Admitting: Obstetrics and Gynecology

## 2016-09-03 DIAGNOSIS — Z1231 Encounter for screening mammogram for malignant neoplasm of breast: Secondary | ICD-10-CM

## 2016-10-01 ENCOUNTER — Ambulatory Visit
Admission: RE | Admit: 2016-10-01 | Discharge: 2016-10-01 | Disposition: A | Discharge status: Home / Self Care | Payer: BC Managed Care – PPO | Source: Ambulatory Visit | Attending: Obstetrics and Gynecology | Admitting: Obstetrics and Gynecology

## 2016-10-01 DIAGNOSIS — Z1231 Encounter for screening mammogram for malignant neoplasm of breast: Secondary | ICD-10-CM | POA: Insufficient documentation

## 2017-11-28 ENCOUNTER — Emergency Department: Payer: BC Managed Care – PPO

## 2017-11-28 ENCOUNTER — Encounter: Payer: Self-pay | Admitting: Emergency Medicine

## 2017-11-28 ENCOUNTER — Emergency Department
Admission: EM | Admit: 2017-11-28 | Discharge: 2017-11-28 | Disposition: A | Discharge status: Home / Self Care | Payer: BC Managed Care – PPO | Attending: Emergency Medicine | Admitting: Emergency Medicine

## 2017-11-28 ENCOUNTER — Other Ambulatory Visit: Payer: Self-pay

## 2017-11-28 DIAGNOSIS — F17210 Nicotine dependence, cigarettes, uncomplicated: Secondary | ICD-10-CM | POA: Diagnosis not present

## 2017-11-28 DIAGNOSIS — R079 Chest pain, unspecified: Secondary | ICD-10-CM | POA: Insufficient documentation

## 2017-11-28 LAB — BASIC METABOLIC PANEL
ANION GAP: 6 (ref 5–15)
BUN: 11 mg/dL (ref 6–20)
CALCIUM: 9 mg/dL (ref 8.9–10.3)
CO2: 28 mmol/L (ref 22–32)
Chloride: 103 mmol/L (ref 98–111)
Creatinine, Ser: 0.57 mg/dL (ref 0.44–1.00)
GFR calc non Af Amer: >60 mL/min (ref >60)
Glucose, Bld: 79 mg/dL (ref 70–99)
Potassium: 3.8 mmol/L (ref 3.5–5.1)
Sodium: 137 mmol/L (ref 135–145)

## 2017-11-28 LAB — CBC
HCT: 40.8 % (ref 35.0–47.0)
Hemoglobin: 14.2 g/dL (ref 12.0–16.0)
MCH: 31.9 pg (ref 26.0–34.0)
MCHC: 34.8 g/dL (ref 32.0–36.0)
MCV: 91.7 fL (ref 80.0–100.0)
Platelets: 226 10*3/uL (ref 150–440)
RBC: 4.45 MIL/uL (ref 3.80–5.20)
RDW: 14.1 % (ref 11.5–14.5)
WBC: 9.3 10*3/uL (ref 3.6–11.0)

## 2017-11-28 LAB — TROPONIN I: Troponin I: <0.03 ng/mL (ref <0.03)

## 2017-11-28 LAB — FIBRIN DERIVATIVES D-DIMER (ARMC ONLY): Fibrin derivatives D-dimer (ARMC): 280.03 ng/mL (FEU) (ref 0.00–499.00)

## 2017-11-28 NOTE — ED Triage Notes (Signed)
Pt to ED via POV c/o substernal chest pain since Friday. Pt states that the pain radiates into her back. Pt has also had fatigue and shortness of breath. Pt states that she feels worn out after taking a shower. Pt is in NAD at this time.

## 2017-11-28 NOTE — ED Triage Notes (Addendum)
First Nurse Note:  [facility] 2 day history of intermittent CP and SOB.  CP radiates to back and right arm..Marland Kitchen

## 2017-11-28 NOTE — ED Provider Notes (Signed)
[facility] Emergency Department Provider Note  Time seen: 3:44 PM  I have reviewed the triage vital signs and the nursing notes.   HISTORY  Chief Complaint Chest Pain    HPI Brianna Berger is a 37 y.o. female with no significant past medical history presents to the emergency department for chest pain.  According to the patient since Friday she has been experiencing intermittent chest pain which she describes as being in the anterior center of her chest radiating to her back.  Describes as a "pulling" type discomfort.  Denies any nausea, vomiting, shortness of breath or diaphoresis.  No leg pain.  Does occasionally have swelling in bilateral ankles but denies any unilateral swelling.  Patient has a progesterone secreting IUD per patient.  Denies any history of DVT or PE.  No abdominal pain.  No fever, cough or congestion.   Past Medical History:  Diagnosis Date  . Hives    occasional; unknown cause    There are no active problems to display for this patient.   Past Surgical History:  Procedure Laterality Date  . CHOLECYSTECTOMY  10/20/2010   Procedure: LAPAROSCOPIC CHOLECYSTECTOMY;  Surgeon: Marlane HatcherWilliam S Bradford;  Location: [facility];  Service: General;  Laterality: N/A;  . [facility]UBAL [facility]ATION  [facility]    Prior to Admission medications   Medication Sig Start Date End Date Taking? Authorizing Provider  citalopram (CELEXA) 40 MG tablet Take 40 mg by mouth daily.    [provider]    No Known Allergies  Family History  Problem Relation Age of Onset  . Anesthesia problems Neg Hx     Social History Social History   Tobacco Use  . Smoking status: Current Some Day Smoker    Types: Cigarettes  . Smokeless tobacco: Never Used  Substance Use Topics  . Alcohol use: No  . Drug use: No    Review of Systems Constitutional: Negative for fever. ENT: Negative for recent illness/congestion Cardiovascular: Mild central chest pain that  radiates to her back. Respiratory: Negative for shortness of breath.  Negative for cough. Gastrointestinal: Negative for abdominal pain, vomiting and diarrhea. Musculoskeletal: Negative for leg pain or current swelling. Skin: Negative for skin complaints  Neurological: Negative for headache All other ROS negative  ____________________________________________   PHYSICAL EXAM:  VITAL SIGNS: ED Triage Vitals  Enc Vitals Group     BP 11/28/17 1417 (!) 161/92     Pulse Rate 11/28/17 1416 78     Resp 11/28/17 1416 16     Temp 11/28/17 1416 98.5 F (36.9 C)     Temp Source 11/28/17 1416 Oral     SpO2 11/28/17 1416 99 %     Weight 11/28/17 1417 215 lb (97.5 kg)     Height 11/28/17 1417 5\' 3"  (1.6 m)     Head Circumference --      Peak Flow --      Pain Score 11/28/17 1417 5     Pain Loc --      Pain Edu? --      Excl. in GC? --    Constitutional: Alert and oriented. Well appearing and in no distress. Eyes: Normal exam ENT   Head: Normocephalic and atraumatic   Mouth/Throat: Mucous membranes are moist. Cardiovascular: Normal rate, regular rhythm. No murmur Respiratory: Normal respiratory effort without tachypnea nor retractions. Breath sounds are clear Gastrointestinal: Soft and nontender. No distention.   Musculoskeletal: Nontender with normal range of motion in all  extremities. No lower extremity tenderness or edema. Neurologic:  Normal speech and language. No gross focal neurologic deficits Skin:  Skin is warm, dry and intact.  Psychiatric: Mood and affect are normal.   ____________________________________________    EKG  EKG reviewed and interpreted by myself shows normal sinus rhythm 85 bpm with a narrow QRS, normal axis, normal intervals, no ST changes.  ____________________________________________    RADIOLOGY  Chest x-ray is negative  ____________________________________________   INITIAL IMPRESSION / ASSESSMENT AND PLAN / ED COURSE  Pertinent labs  & imaging results that were available during my care of the patient were reviewed by me and considered in my medical decision making (see chart for details).  Patient presents to the emergency department for chest pain ongoing over the past 2 days, intermittent.  Patient does state slight pleuritic component somewhat worse with deep breathing but denies any "pain."  States it is more of a "pulling" type discomfort.  Patient's vitals are very reassuring.  Patient's lab work has resulted largely normal including a negative troponin.  Chest x-ray is clear.  EKG however does show an S1Q3T3 pattern.  Given the slight pleuritic component and this EKG finding we will perform a d-dimer.  If the patient's d-dimer is negative I believe the patient could safely be discharged home as her work-up is otherwise reassuring.  If positive will proceed with CT angiography.  Patient agreeable to plan of care.  D-dimer is negative.  Overall the patient appears very well we will discharge the patient home with my normal chest pain return precautions.  ____________________________________________   FINAL CLINICAL IMPRESSION(S) / ED DIAGNOSES  Chest pain    Minna Antis, MD 11/28/17 1650

## 2017-11-28 NOTE — ED Notes (Signed)
NAD noted at time of D/C. Pt denies questions or concerns. Pt ambulatory to the lobby at this time.  

## 2018-11-18 ENCOUNTER — Ambulatory Visit: Payer: BC Managed Care – PPO | Admitting: Internal Medicine

## 2018-11-30 ENCOUNTER — Other Ambulatory Visit: Payer: Self-pay

## 2018-12-02 ENCOUNTER — Encounter: Payer: BC Managed Care – PPO | Admitting: Internal Medicine

## 2018-12-02 NOTE — Progress Notes (Signed)
CANCELLED  Patient ID: Brianna Berger, female   DOB: [birth year], 38 y.o.   MRN: [id number]   HPI: Brianna Berger is a 38 y.o.-year-old female, referred by Brianna Berger PCP, Dr. Sabra Heck, for management of DM2, dx in **, non-insulin-dependent, uncontrolled, with complications (macular edema).  Reviewed latest HbA1c level: 08/01/2018: HbA1c 10.3%, glucose 425 08/04/2017: HbA1c 5.9% 01/13/2017: HbA1c 5.5% No results found for: HGBA1C  Pt is on a regimen of: - Metformin ER 500 mg with dinner - Basaglar 20 units at bedtime-recently started She was previously on Victoza.  Pt checks Brianna Berger sugars ** a day and they are: - am: n/c - 2h after b'fast: n/c - before lunch: n/c - 2h after lunch: n/c - before dinner: n/c - 2h after dinner: n/c - bedtime: n/c - nighttime: n/c Lowest sugar was **; she has hypoglycemia awareness at 70.  Highest sugar was **.  Glucometer: One Touch ultra  Pt's meals are: - Breakfast: - Lunch: - Dinner: - Snacks:  - no CKD, last BUN/creatinine:  08/01/2018: 10/0.7, GFR 93 Lab Results  Component Value Date   BUN 11 11/28/2017   BUN 7 10/21/2010   CREATININE 0.57 11/28/2017   CREATININE 0.60 10/21/2010  On valsartan 320.  -+ HL; last set of lipids: 08/01/2018: 121/493/31.7/n/c No results found for: CHOL, HDL, LDLCALC, LDLDIRECT, TRIG, CHOLHDL  - last eye exam was in 2020. No DR. + macular edema.  - no numbness and tingling in Brianna Berger feet.  Pt has FH of DM in maternal uncle.  ROS: Constitutional: no weight gain, no weight loss, no fatigue, no subjective hyperthermia, no subjective hypothermia, no nocturia Eyes: no blurry vision, no xerophthalmia ENT: no sore throat, no nodules palpated in neck, no dysphagia, no odynophagia, no hoarseness, no tinnitus, no hypoacusis Cardiovascular: no CP, no SOB, no palpitations, no leg swelling Respiratory: no cough, no SOB, no wheezing Gastrointestinal: no N, no V, no D, no C, no acid reflux Musculoskeletal: no muscle, no joint  aches Skin: no rash, no hair loss Neurological: no tremors, no numbness or tingling/no dizziness/no HAs Psychiatric: no depression, no anxiety  PE: There were no vitals taken for this visit. Wt Readings from Last 3 Encounters:  11/28/17 215 lb (97.5 kg)  04/02/13 225 lb (102.1 kg)  10/20/10 237 lb 3.4 oz (107.6 kg)   Constitutional: overweight, in NAD Eyes: PERRLA, EOMI, no exophthalmos ENT: moist mucous membranes, no thyromegaly, no cervical lymphadenopathy Cardiovascular: RRR, No MRG Respiratory: CTA B Gastrointestinal: abdomen soft, NT, ND, BS+ Musculoskeletal: no deformities, strength intact in all 4 Skin: moist, warm, no rashes Neurological: no tremor with outstretched hands, DTR normal in all 4  ASSESSMENT: 1. DM2, insulin-dependent, uncontrolled, with complications -Macular edema  PLAN:  1. Patient with recently worsening of diabetes, on oral antidiabetic regimen + long-acting insulin added recently, due to very high blood sugars. - I suggested to:  There are no Patient Instructions on file for this visit. - Strongly advised Brianna Berger to start checking sugars at different times of the day - check 1-2x a day, rotating checks - discussed about CBG targets for treatment: 80-130 mg/dL before meals and <180 mg/dL after meals; target HbA1c <7%. - given sugar log and advised how to fill it and to bring it at next appt  - given foot care handout and explained the principles  - given instructions for hypoglycemia management "15-15 rule"  - advised for yearly eye exams  - Return to clinic in 3 mo with sugar log  Carlus Pavlovristina Brysan Mcevoy, MD PhD [facility]  This encounter was created in error - please disregard.

## 2018-12-12 ENCOUNTER — Encounter: Payer: Self-pay | Admitting: Internal Medicine

## 2018-12-12 ENCOUNTER — Other Ambulatory Visit: Payer: Self-pay

## 2018-12-12 DIAGNOSIS — R202 Paresthesia of skin: Secondary | ICD-10-CM

## 2018-12-16 ENCOUNTER — Encounter: Payer: Self-pay | Admitting: Internal Medicine

## 2018-12-16 ENCOUNTER — Other Ambulatory Visit: Payer: Self-pay

## 2018-12-16 ENCOUNTER — Ambulatory Visit: Payer: BC Managed Care – PPO | Admitting: Internal Medicine

## 2018-12-16 VITALS — BP 148/98 | HR 96 | Ht 63.0 in | Wt 202.0 lb

## 2018-12-16 DIAGNOSIS — E1165 Type 2 diabetes mellitus with hyperglycemia: Secondary | ICD-10-CM

## 2018-12-16 DIAGNOSIS — Z794 Long term (current) use of insulin: Secondary | ICD-10-CM | POA: Diagnosis not present

## 2018-12-16 LAB — POCT GLYCOSYLATED HEMOGLOBIN (HGB A1C): Hemoglobin A1C: 6 % — AB (ref 4.0–5.6)

## 2018-12-16 LAB — GLUCOSE, POCT (MANUAL RESULT ENTRY): POC Glucose: 107 mg/dl — AB (ref 70–99)

## 2018-12-16 MED ORDER — METFORMIN HCL ER 500 MG PO TB24: 1500.0000 mg | ORAL_TABLET | Freq: Every day | ORAL | 3 refills | Status: DC

## 2018-12-16 MED ORDER — ONETOUCH ULTRA 2 W/DEVICE KIT: PACK | 0 refills | Status: AC

## 2018-12-16 NOTE — Progress Notes (Addendum)
Patient ID: Brianna Berger, female   DOB: 1980-06-22, 38 y.o.   MRN: [id number]   HPI: Brianna Berger is a 38 y.o.-year-old female, referred by her PCP, Brianna Berger, for management of DM2, dx in 12/2017, prediabetes previously, insulin-dependent since 07/2018, uncontrolled, without long-term complications. Her aunt is also my pt: Brianna Berger.   Reviewed latest HbA1c level: 08/01/2018: HbA1c 10.3%, Glucose 425 03/18/2018: HbA1c 6.5% 08/04/2017: HbA1c 5.9% 12/16/2016: HbA1c 5.5% No results found for: HGBA1C  Pt is on a regimen of: - Metformin ER 500 mg with b'fast  - Basaglar 22 units at bedtime-started 07/2018 She was on Victoza  - stopped 03/2018.  Pt checks her sugars 1-2x a day and they are: - am: 120-150 - 2h after b'fast: n/c - before lunch: n/c - 2h after lunch: n/c - before dinner: n/c - 2h after dinner: n/c - bedtime: 140-160, 298, 302 - nighttime: n/c Lowest sugar was 61; she has hypoglycemia awareness at 70.  Highest sugar was 500s.  Glucometer: One Touch Ultra 2  Patient has changed her diet after her HbA1c returned very high 4 months ago: - Breakfast: protein shake - low carb; or oatmeal - Lunch: protein shake, grilled chicken salad  - Dinner: meat + salad Drinks diet sodas.  Stopped alcohol in the last few months. She is walking more lately -3 times a week She lost 25 lbs since 07/2018.  - no CKD, last BUN/creatinine:  08/01/2018: 10/0.7 Lab Results  Component Value Date   BUN 11 11/28/2017   BUN 7 10/21/2010   CREATININE 0.57 11/28/2017   CREATININE 0.60 10/21/2010  On Valsartan 320.  - + HL; last set of lipids: 08/01/2018: 121/493/31.7 03/18/2018: LDL 44 No results found for: CHOL, HDL, LDLCALC, LDLDIRECT, TRIG, CHOLHDL  - last eye exam was this year: + DR (? - reportedly "I had bleeding behind my eye"). Dr. Radene Ou.  - no numbness and tingling in her feet.  Pt has FH of DM in M uncles and 1 aunt, MGM - prediabetes.  She also has a history of  HTN.  Previous thyroid tests have been normal: 08/04/2017: TSH 1.78. No results found for: TSH   She is a Oncologist.  ROS: Constitutional: + Weight gain, + increased appetite, + fatigue, + subjective hyperthermia, + subjective hypothermia, no nocturia, + insomnia, + decreased urination Eyes: + blurry vision, no xerophthalmia ENT: no sore throat, no nodules palpated in neck, + dysphagia, no odynophagia, no hoarseness, no tinnitus, no hypoacusis Cardiovascular: no CP, no SOB, no palpitations, + leg swelling Respiratory: + Cough, no SOB, + wheezing Gastrointestinal: + N, no V, no D, no C, no acid reflux Musculoskeletal: no muscle, + joint aches Skin: no rash, + hair loss Neurological: no tremors, no numbness or tingling/no dizziness/+ HAs Psychiatric: + depression, + anxiety  Past Medical History:  Diagnosis Date  . Hives    occasional; unknown cause   Past Surgical History:  Procedure Laterality Date  . CHOLECYSTECTOMY  10/20/2010   Procedure: LAPAROSCOPIC CHOLECYSTECTOMY;  Surgeon: Scherry Ran;  Location: [facility];  Service: General;  Laterality: N/A;  . [place]  2010   [place] History   Socioeconomic History  . Marital status: Divorced    Spouse name: Not on file  . Number of children: 3  . Years of education: Not on file  . Highest education level: Not on file  Occupational History  . Not on file  Social Needs  . Financial  resource strain: Not on file  . Food insecurity    Worry: Not on file    Inability: Not on file  . Transportation needs    Medical: Not on file    Non-medical: Not on file  Tobacco Use  . Smoking status: Current Some Day Smoker -half a pack a day    Types: Cigarettes  . Smokeless tobacco: Never Used  Substance and Sexual Activity  . Alcohol use: No  . Drug use: No   Current Outpatient Medications  Medication Sig Dispense Refill  . escitalopram (LEXAPRO) 20 MG tablet 20 mg daily.    . Insulin  Glargine (BASAGLAR KWIKPEN) 100 UNIT/ML SOPN 22 Units at bedtime.    . metFORMIN (GLUCOPHAGE-XR) 500 MG 24 hr tablet Take 1 tablet (500 mg total) by mouth daily with breakfast    . valsartan-hydrochlorothiazide (DIOVAN-HCT) 320-25 MG tablet TAKE 1 TABLET BY MOUTH EVERY DAY    . Blood Glucose Monitoring Suppl (ONE TOUCH ULTRA 2) w/Device KIT Use to check blood sugar  0   No current facility-administered medications for this visit.    No Known Allergies Family History  Problem Relation Age of Onset  . Anesthesia problems Neg Hx    PE: BP (!) 148/98   Pulse 96   Ht _0  (1.6 m) Comment: measured  Wt 202 lb (91.6 kg)   LMP 12/08/2018   SpO2 99%   BMI 35.78 kg/m  Wt Readings from Last 3 Encounters:  12/16/18 202 lb (91.6 kg)  11/28/17 215 lb (97.5 kg)  04/02/13 225 lb (102.1 kg)   Constitutional: overweight, in NAD Eyes: PERRLA, EOMI, no exophthalmos ENT: moist mucous membranes, no thyromegaly, no cervical lymphadenopathy Cardiovascular: Tachycardia, RR, No MRG Respiratory: CTA B Gastrointestinal: abdomen soft, NT, ND, BS+ Musculoskeletal: no deformities, strength intact in all 4 Skin: moist, warm, no rashes Neurological: no tremor with outstretched hands, DTR normal in all 4  ASSESSMENT: 1. DM2, insulin-dependent, recently diagnosed  PLAN:  1. Patient with recent diagnosis of uncontrolled diabetes, on oral antidiabetic regimen and now also on basal insulin, with significant improvement in blood sugar control after starting insulin, improving her diet, and starting walking.  At today's visit, we checked her HbA1c and this is now 6%, decreased from 10.3% 4 months ago.  At the time of this visit, glucose is 107.  -She mentions that her sugars at home did improve, but she occasionally has hyperglycemic spikes not necessarily associated with diet, which she is continuing to adjust.  As of now, she is drinking smoothies frequently in place of breakfast and lunch and we discussed it  would probably be better for her to eat solid food with his meals.  This will keep her sugars more stable and will keep her more full, since now she complains of hunger throughout the day.  We also discussed about a referral to nutrition and she would think about this. -As of now, I suggested to increase the dose of metformin to 1500 mg daily which will help decrease in her appetite and also hopefully with the fluctuations in the blood sugars.  We will keep the insulin dose the same for now but will check her for type 1 diabetes today: Orders Placed This Encounter  Procedures  . C-peptide  . Glucose, fasting  . Anti-islet cell antibody  . ZNT8 Antibodies  . Glutamic acid decarboxylase auto abs  -If she has type 2 diabetes, I am hoping they will will be able to decrease and even  stop her insulin dose in the future low  In the future, I plan to start her on the GLP-1 receptor agonist which should help further with her hunger and with improving her weight. - For now I advised her to check with her insurance whether weekly GLP-1 receptor agonists are covered. - I suggested to:  Patient Instructions  Please increase: - Metformin ER to 1000 mg with dinner. If you tolerate this well, can increase further to 1500 mg with dinner.  Please continue: - Basaglar 22 units at bedtime  Please check with your insurance if they cover: - Ozempic - Trulicity - Bydureon  Please let me know if the sugars are consistently <80 or >200.  Please return in 3 months with your sugar log.   - continue checking sugars at different times of the day - check 1-2x a day, rotating checks - discussed about CBG targets for treatment: 80-130 mg/dL before meals and <180 mg/dL after meals; target HbA1c <7%. - given sugar log and advised how to fill it and to bring it at next appt  - given foot care handout and explained the principles  - given instructions for hypoglycemia management "15-15 rule"  - advised for yearly eye  exams  - Return to clinic in 3 mo with sugar log   Component     Latest Ref Rng & Units 12/16/2018  C-Peptide     0.80 - 3.85 ng/mL 2.49  Glucose, Plasma     65 - 99 mg/dL 105 (H)  Islet Cell Ab     Neg:<1:1 Negative  ZNT8 Antibodies     U/mL <15  Glutamic Acid Decarb Ab     <5 IU/mL <5  Investigation for DM1 is negative.  Philemon Kingdom, MD PhD [facility]

## 2018-12-16 NOTE — Patient Instructions (Addendum)
Please increase: - Metformin ER to 1000 mg with dinner. If you tolerate this well, can increase further to 1500 mg with dinner.  Please continue: - Basaglar 22 units at bedtime  Please check with your insurance if they cover: - Ozempic - Trulicity - Bydureon  Please let me know if the sugars are consistently <80 or >200.  Please return in 3 months with your sugar log.   PATIENT INSTRUCTIONS FOR TYPE 2 DIABETES:  **Please join MyChart!** - see attached instructions about how to join if you have not done so already.  DIET AND EXERCISE Diet and exercise is an important part of diabetic treatment.  We recommended aerobic exercise in the form of brisk walking (working between 40-60% of maximal aerobic capacity, similar to brisk walking) for 150 minutes per week (such as 30 minutes five days per week) along with 3 times per week performing 'resistance' training (using various gauge rubber tubes with handles) 5-10 exercises involving the major muscle groups (upper body, lower body and core) performing 10-15 repetitions (or near fatigue) each exercise. Start at half the above goal but build slowly to reach the above goals. If limited by weight, joint pain, or disability, we recommend daily walking in a swimming pool with water up to waist to reduce pressure from joints while allow for adequate exercise.    BLOOD GLUCOSES Monitoring your blood glucoses is important for continued management of your diabetes. Please check your blood glucoses 2-4 times a day: fasting, before meals and at bedtime (you can rotate these measurements - e.g. one day check before the 3 meals, the next day check before 2 of the meals and before bedtime, etc.).   HYPOGLYCEMIA (low blood sugar) Hypoglycemia is usually a reaction to not eating, exercising, or taking too much insulin/ other diabetes drugs.  Symptoms include tremors, sweating, hunger, confusion, headache, etc. Treat IMMEDIATELY with 15 grams of Carbs: . 4  glucose tablets .  cup regular juice/soda . 2 tablespoons raisins . 4 teaspoons sugar . 1 tablespoon honey Recheck blood glucose in 15 mins and repeat above if still symptomatic/blood glucose <100.  RECOMMENDATIONS TO REDUCE YOUR RISK OF DIABETIC COMPLICATIONS: * Take your prescribed MEDICATION(S) * Follow a DIABETIC diet: Complex carbs, fiber rich foods, (monounsaturated and polyunsaturated) fats * AVOID saturated/trans fats, high fat foods, >2,300 mg salt per day. * EXERCISE at least 5 times a week for 30 minutes or preferably daily.  * DO NOT SMOKE OR DRINK more than 1 drink a day. * Check your FEET every day. Do not wear tightfitting shoes. Contact us if you develop an ulcer * See your EYE doctor once a year or more if needed * Get a FLU shot once a year * Get a PNEUMONIA vaccine once before and once after age 66 years  GOALS:  * Your Hemoglobin A1c of <7%  * fasting sugars need to be <130 * after meals sugars need to be <180 (2h after you start eating) * Your Systolic BP should be 409 or lower  * Your Diastolic BP should be 80 or lower  * Your HDL (Good Cholesterol) should be 40 or higher  * Your LDL (Bad Cholesterol) should be 100 or lower. * Your Triglycerides should be 150 or lower  * Your Urine microalbumin (kidney function) should be <30 * Your Body Mass Index should be 25 or lower    Please consider the following ways to cut down carbs and fat and increase fiber and micronutrients in your  diet: - substitute whole grain for white bread or pasta - substitute brown rice for white rice - substitute 90-calorie flat bread pieces for slices of bread when possible - substitute sweet potatoes or yams for white potatoes - substitute humus for margarine - substitute tofu for cheese when possible - substitute almond or rice milk for regular milk (would not drink soy milk daily due to concern for soy estrogen influence on breast cancer risk) - substitute dark chocolate for other  sweets when possible - substitute water - can add lemon or orange slices for taste - for diet sodas (artificial sweeteners will trick your body that you can eat sweets without getting calories and will lead you to overeating and weight gain in the long run) - do not skip breakfast or other meals (this will slow down the metabolism and will result in more weight gain over time)  - can try smoothies made from fruit and almond/rice milk in am instead of regular breakfast - can also try old-fashioned (not instant) oatmeal made with almond/rice milk in am - order the dressing on the side when eating salad at a restaurant (pour less than half of the dressing on the salad) - eat as little meat as possible - can try juicing, but should not forget that juicing will get rid of the fiber, so would alternate with eating raw veg./fruits or drinking smoothies - use as little oil as possible, even when using olive oil - can dress a salad with a mix of balsamic vinegar and lemon juice, for e.g. - use agave nectar, stevia sugar, or regular sugar rather than artificial sweateners - steam or broil/roast veggies  - snack on veggies/fruit/nuts (unsalted, preferably) when possible, rather than processed foods - reduce or eliminate aspartame in diet (it is in diet sodas, chewing gum, etc) Read the labels!  Try to read Dr. Janene Harvey book: "Program for Reversing Diabetes" for other ideas for healthy eating.

## 2018-12-19 LAB — C-PEPTIDE: C-Peptide: 2.49 ng/mL (ref 0.80–3.85)

## 2018-12-19 LAB — GLUCOSE, FASTING: Glucose, Plasma: 105 mg/dL — ABNORMAL HIGH (ref 65–99)

## 2018-12-19 LAB — GLUTAMIC ACID DECARBOXYLASE AUTO ABS: Glutamic Acid Decarb Ab: <5 IU/mL (ref <5)

## 2018-12-27 ENCOUNTER — Telehealth: Payer: Self-pay

## 2018-12-27 LAB — ANTI-ISLET CELL ANTIBODY: Islet Cell Ab: NEGATIVE

## 2018-12-27 LAB — ZNT8 ANTIBODIES: ZNT8 Antibodies: <15 U/mL

## 2018-12-27 NOTE — Telephone Encounter (Signed)
Notified patient.

## 2018-12-27 NOTE — Telephone Encounter (Signed)
-----   Message from Philemon Kingdom, MD sent at 12/27/2018  7:27 AM EDT ----- Brianna Berger, can you please call pt: Good news: all labs are now back >> no signs of type 1 diabetes!

## 2019-01-19 ENCOUNTER — Ambulatory Visit (INDEPENDENT_AMBULATORY_CARE_PROVIDER_SITE_OTHER): Payer: BC Managed Care – PPO | Admitting: Neurology

## 2019-01-19 ENCOUNTER — Other Ambulatory Visit: Payer: Self-pay

## 2019-01-19 DIAGNOSIS — R202 Paresthesia of skin: Secondary | ICD-10-CM | POA: Diagnosis not present

## 2019-01-19 DIAGNOSIS — G5602 Carpal tunnel syndrome, left upper limb: Secondary | ICD-10-CM

## 2019-01-19 NOTE — Procedures (Signed)
[facility]  [address]  [place], [place] [address] Tel: [phone] Fax:  [phone] Test Date:  01/19/2019  Patient: Brianna Berger DOB: 1980/11/15 Physician: Narda Amber, DO  Sex: Female Height: 5\' 3"  Ref Phys: Adron Bene. Amedeo Plenty, MD  ID#: [id number] Temp: 34.0C Technician:    Patient Complaints: This is a 38 year old female referred for evaluation of left hand paresthesias and pain.  NCV & EMG Findings: Extensive electrodiagnostic testing of the left upper extremity shows:  1. Left mixed palmar sensory response shows prolonged latency (Median Palm, 2.3 ms).  Left median ulnar sensory responses are within normal limits.   2. Left median and ulnar motor responses are within normal limits.   3. There is no evidence of active or chronic motor axonal loss changes affecting any of the tested muscles.  Motor unit configuration and recruitment pattern is within normal limits.    Impression: 1. Left median neuropathy at or distal to the wrist (very mild), consistent with a clinical diagnosis of carpal tunnel syndrome.   2. There is no evidence of left ulnar neuropathy or cervical radiculopathy.   ___________________________ Narda Amber, DO    Nerve Conduction Studies Anti Sensory Summary Table   Site NR Peak (ms) Norm Peak (ms) P-T Amp (V) Norm P-T Amp  Left Median Anti Sensory (2nd Digit)  34C  Wrist    3.4 <3.4 52.8 >20  Left Ulnar Anti Sensory (5th Digit)  34C  Wrist    2.6 <3.1 50.8 >12   Motor Summary Table   Site NR Onset (ms) Norm Onset (ms) O-P Amp (mV) Norm O-P Amp Site1 Site2 Delta-0 (ms) Dist (cm) Vel (m/s) Norm Vel (m/s)  Left Median Motor (Abd Poll Brev)  34C  Wrist    3.6 <3.9 10.0 >6 Elbow Wrist 5.2 27.0 52 >50  Elbow    8.8  8.8         Left Ulnar Motor (Abd Dig Minimi)  34C  Wrist    2.7 <3.1 9.5 >7 B Elbow Wrist 3.9 22.0 56 >50  B Elbow    6.6  8.2  A Elbow B Elbow 1.8 10.0 56 >50  A Elbow    8.4  8.1          Comparison  Summary Table   Site NR Peak (ms) Norm Peak (ms) P-T Amp (V) Site1 Site2 Delta-P (ms) Norm Delta (ms)  Left Median/Ulnar Palm Comparison (Wrist - 8cm)  34C  Median Palm    2.3 <2.2 32.6 Median Palm Ulnar Palm 0.9   Ulnar Palm    1.4 <2.2 13.8       EMG   Side Muscle Ins Act Fibs Psw Fasc Number Recrt Dur Dur. Amp Amp. Poly Poly. Comment  Left 1stDorInt Nml Nml Nml Nml Nml Nml Nml Nml Nml Nml Nml Nml N/A  Left Abd Poll Brev Nml Nml Nml Nml Nml Nml Nml Nml Nml Nml Nml Nml N/A  Left PronatorTeres Nml Nml Nml Nml Nml Nml Nml Nml Nml Nml Nml Nml N/A  Left Biceps Nml Nml Nml Nml Nml Nml Nml Nml Nml Nml Nml Nml N/A  Left Triceps Nml Nml Nml Nml Nml Nml Nml Nml Nml Nml Nml Nml N/A  Left Deltoid Nml Nml Nml Nml Nml Nml Nml Nml Nml Nml Nml Nml N/A      Waveforms:

## 2019-03-23 ENCOUNTER — Ambulatory Visit: Payer: BC Managed Care – PPO | Admitting: Internal Medicine

## 2019-05-19 ENCOUNTER — Ambulatory Visit: Payer: BC Managed Care – PPO | Attending: Internal Medicine

## 2019-05-19 DIAGNOSIS — Z23 Encounter for immunization: Secondary | ICD-10-CM | POA: Insufficient documentation

## 2019-05-19 NOTE — Progress Notes (Signed)
   Covid-19 Vaccination Clinic  Name:  Brianna Berger    MRN: [id number] DOB: [birth year]  05/19/2019  [name] was observed post Covid-19 immunization for 15 minutes without incident. She was provided with Vaccine Information Sheet and instruction to access the V-Safe system.   [name] was instructed to call 911 with any severe reactions post vaccine: Marland Kitchen Difficulty breathing  . Swelling of face and throat  . A fast heartbeat  . A bad rash all over body  . Dizziness and weakness   Immunizations Administered    Name Date Dose VIS Date Route   Pfizer COVID-19 Vaccine 05/19/2019 12:18 PM 0.3 mL 02/24/2019 Intramuscular   Manufacturer: [employer], [employer]   Lot: [id number]   NDC: [address]-7

## 2019-06-12 ENCOUNTER — Ambulatory Visit: Payer: BC Managed Care – PPO | Attending: Internal Medicine

## 2019-06-12 DIAGNOSIS — Z23 Encounter for immunization: Secondary | ICD-10-CM

## 2019-06-12 NOTE — Progress Notes (Signed)
   Covid-19 Vaccination Clinic  Name:  STUTI SANDIN    MRN: [id number] DOB: Dec 19, 1980  06/12/2019  Ms. Jarnigan was observed post Covid-19 immunization for 15 minutes without incident. She was provided with Vaccine Information Sheet and instruction to access the V-Safe system.   Ms. Hartl was instructed to call 911 with any severe reactions post vaccine: Marland Kitchen Difficulty breathing  . Swelling of face and throat  . A fast heartbeat  . A bad rash all over body  . Dizziness and weakness   Immunizations Administered    Name Date Dose VIS Date Route   Pfizer COVID-19 Vaccine 06/12/2019 10:36 AM 0.3 mL 02/24/2019 Intramuscular   Manufacturer: [employer], [employer]   Lot: [id number]   NDC: [address]-9

## 2020-07-23 IMAGING — CR DG CHEST 2V
1 series · 2 of 2 positions shown · non-contrast
Comparison: None.

CLINICAL DATA: Chest pain, history of tobacco use

EXAM:
CHEST - 2 VIEW

[Series 1: dg chest 2 view · 0.14mm/px · 2 of 2 slices shown]
[im 1/2]
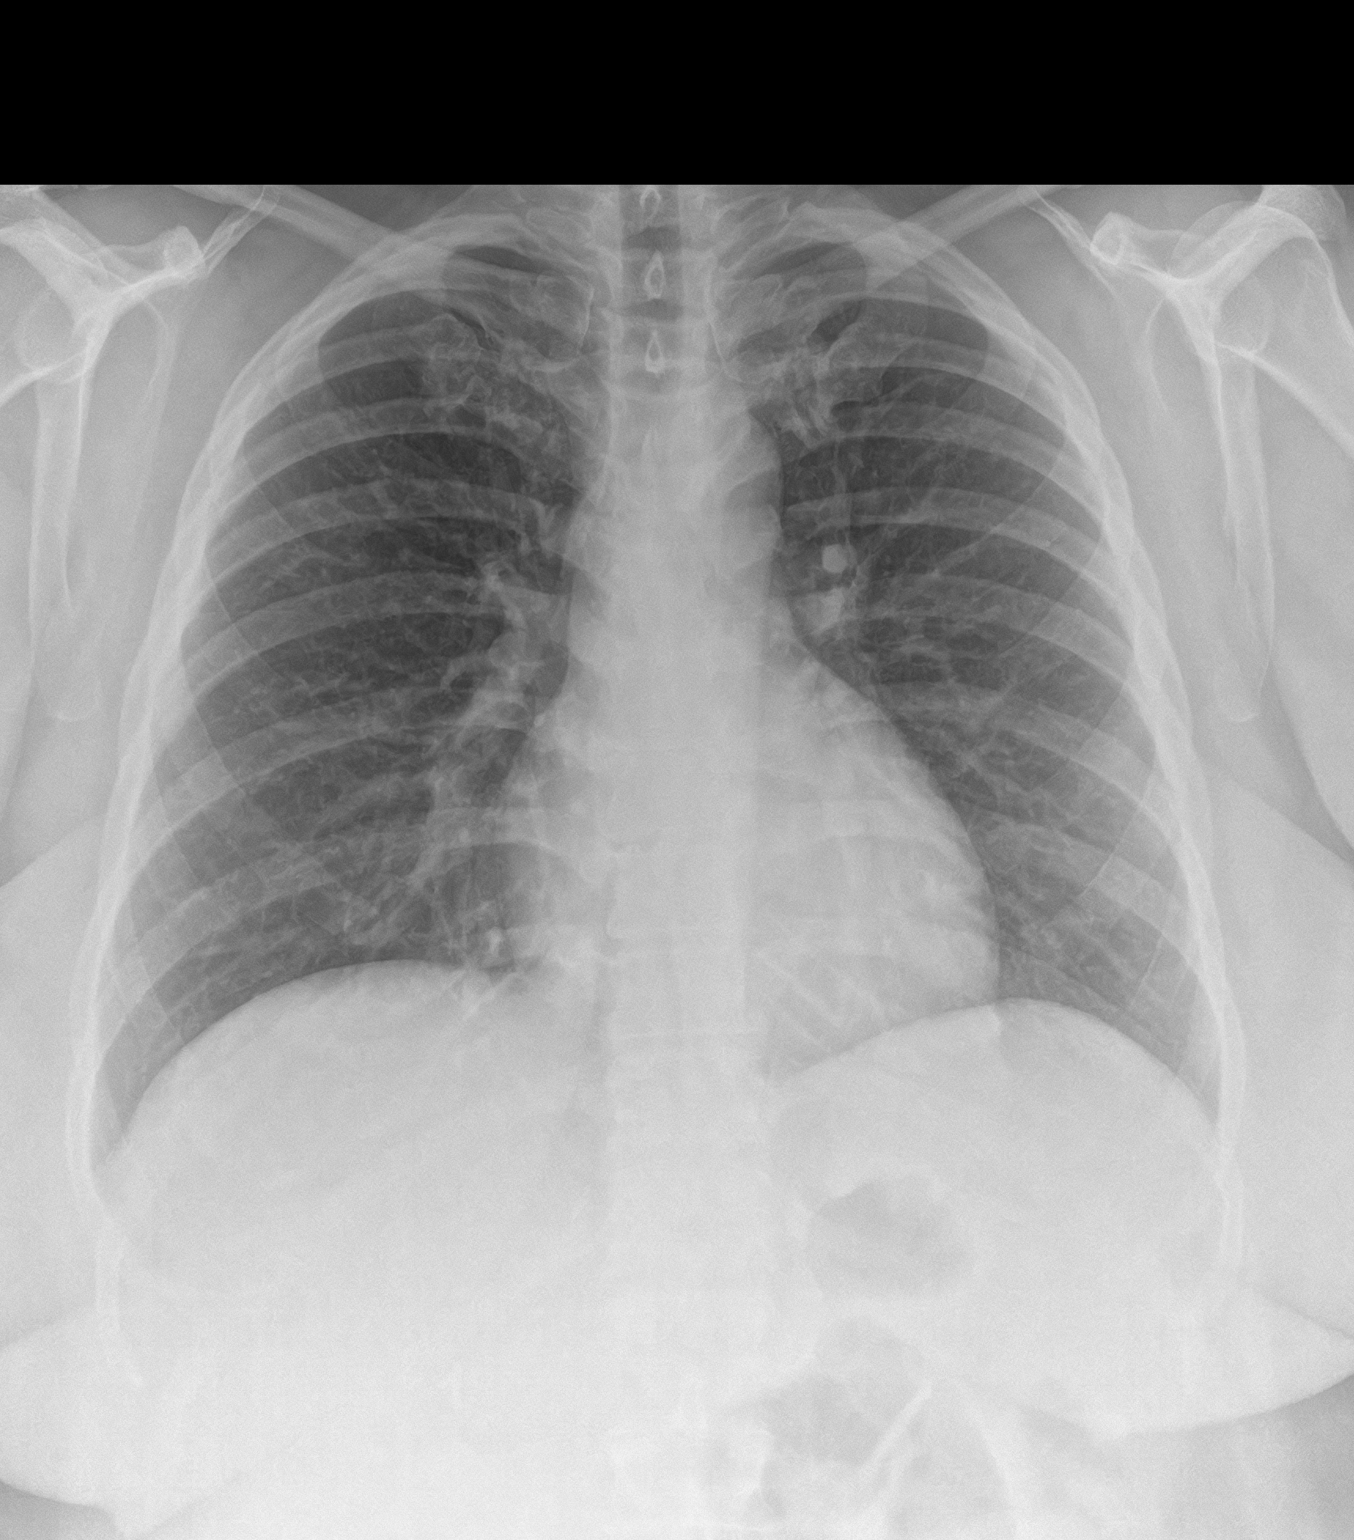
[im 2/2]
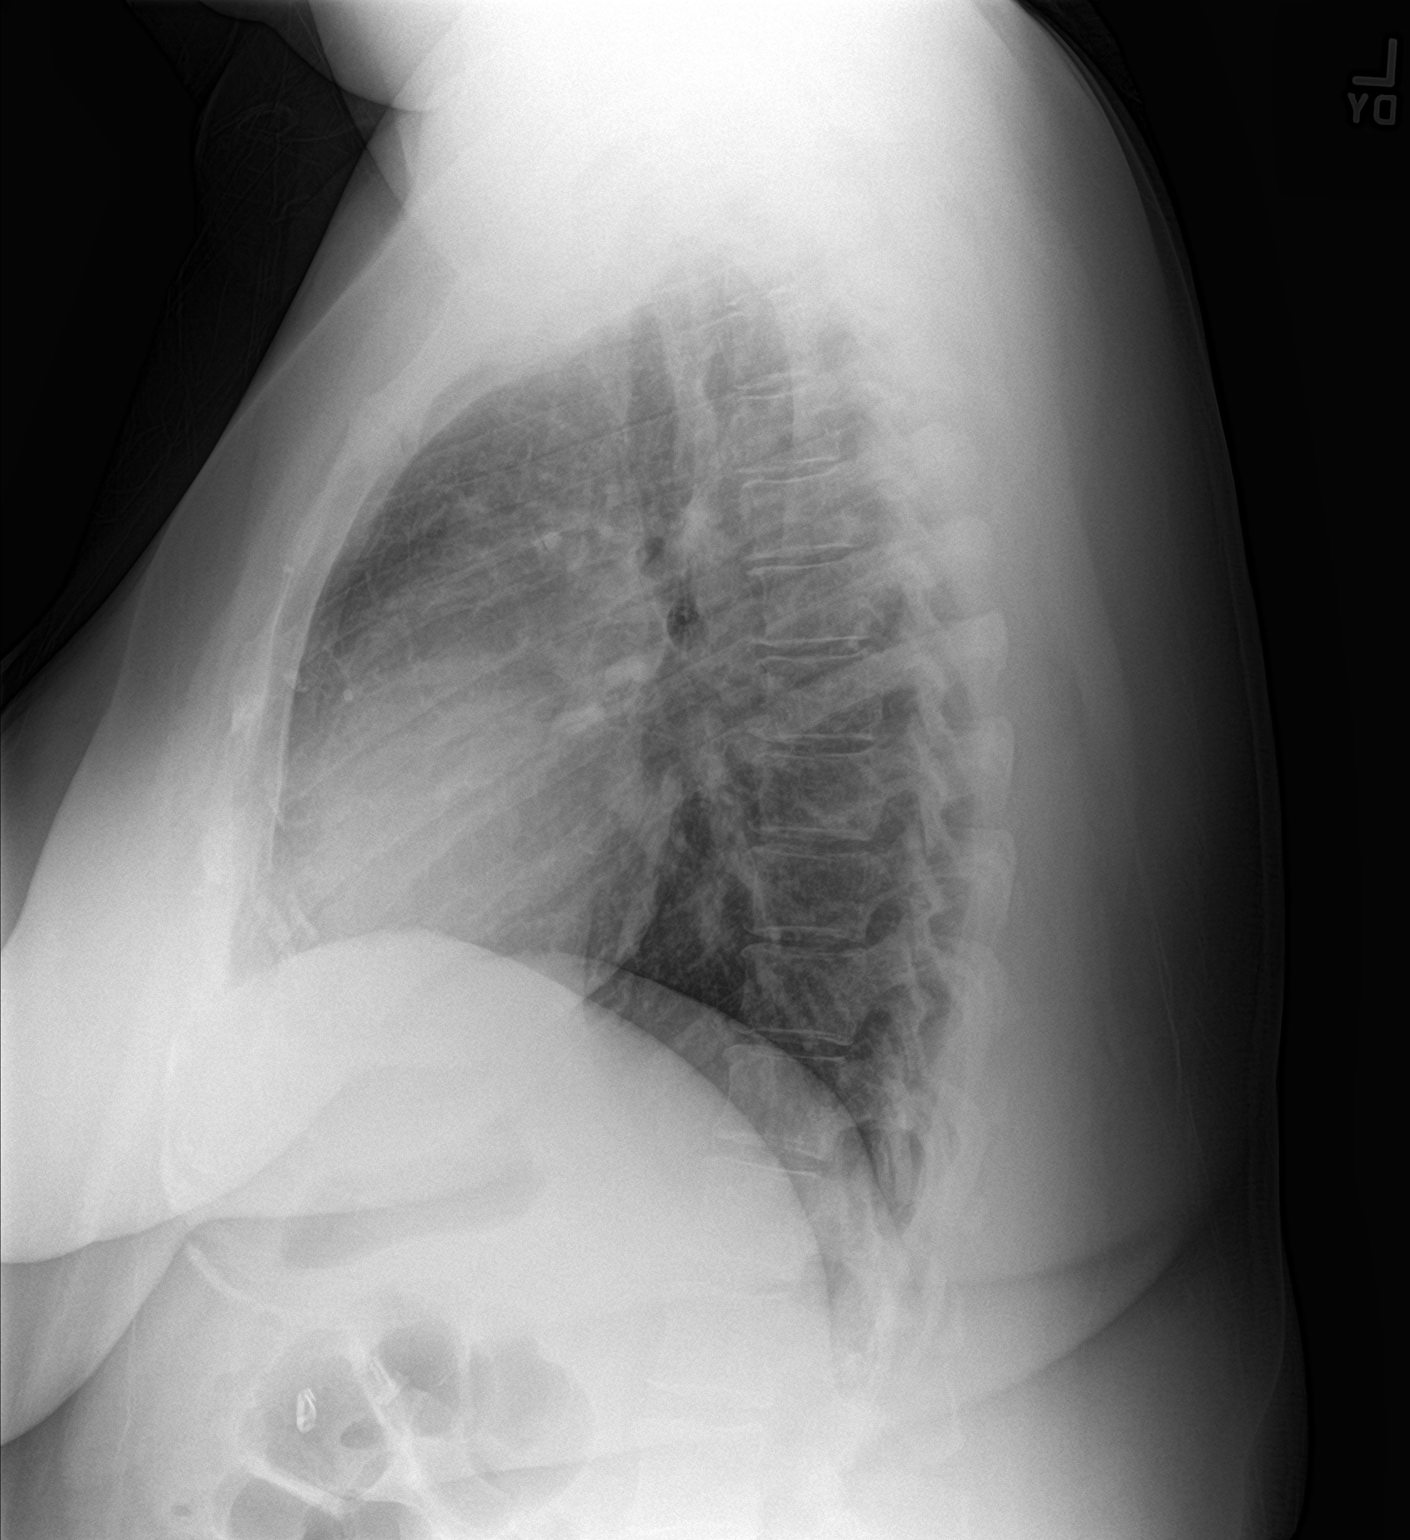

[2 of 2 positions shown; findings below may reference images not displayed]

FINDINGS: The heart size and mediastinal contours are within normal limits.
Both lungs are clear. The visualized skeletal structures are
unremarkable.
IMPRESSION: No active cardiopulmonary disease.

## 2020-10-11 ENCOUNTER — Ambulatory Visit: Payer: Self-pay

## 2021-07-07 ENCOUNTER — Other Ambulatory Visit: Payer: Self-pay

## 2021-07-07 ENCOUNTER — Emergency Department: Payer: BC Managed Care – PPO

## 2021-07-07 ENCOUNTER — Emergency Department
Admission: EM | Admit: 2021-07-07 | Discharge: 2021-07-07 | Disposition: A | Discharge status: Home / Self Care | Payer: BC Managed Care – PPO | Attending: Emergency Medicine | Admitting: Emergency Medicine

## 2021-07-07 DIAGNOSIS — R110 Nausea: Secondary | ICD-10-CM | POA: Insufficient documentation

## 2021-07-07 DIAGNOSIS — R1031 Right lower quadrant pain: Secondary | ICD-10-CM | POA: Insufficient documentation

## 2021-07-07 DIAGNOSIS — N3000 Acute cystitis without hematuria: Secondary | ICD-10-CM

## 2021-07-07 DIAGNOSIS — R197 Diarrhea, unspecified: Secondary | ICD-10-CM | POA: Insufficient documentation

## 2021-07-07 DIAGNOSIS — R1012 Left upper quadrant pain: Secondary | ICD-10-CM | POA: Diagnosis not present

## 2021-07-07 DIAGNOSIS — E119 Type 2 diabetes mellitus without complications: Secondary | ICD-10-CM | POA: Diagnosis not present

## 2021-07-07 LAB — URINALYSIS, ROUTINE W REFLEX MICROSCOPIC
Bilirubin Urine: NEGATIVE
Glucose, UA: >=500 mg/dL — AB
Hgb urine dipstick: NEGATIVE
Ketones, ur: NEGATIVE mg/dL
Nitrite: NEGATIVE
Protein, ur: NEGATIVE mg/dL
Specific Gravity, Urine: 1.03 (ref 1.005–1.030)
pH: 5 (ref 5.0–8.0)

## 2021-07-07 LAB — COMPREHENSIVE METABOLIC PANEL
ALT: 19 U/L (ref 0–44)
AST: 16 U/L (ref 15–41)
Albumin: 4.1 g/dL (ref 3.5–5.0)
Alkaline Phosphatase: 51 U/L (ref 38–126)
Anion gap: 10 (ref 5–15)
BUN: 13 mg/dL (ref 6–20)
CO2: 28 mmol/L (ref 22–32)
Calcium: 9.5 mg/dL (ref 8.9–10.3)
Chloride: 97 mmol/L — ABNORMAL LOW (ref 98–111)
Creatinine, Ser: 0.58 mg/dL (ref 0.44–1.00)
GFR, Estimated: >60 mL/min (ref >60)
Glucose, Bld: 221 mg/dL — ABNORMAL HIGH (ref 70–99)
Potassium: 3.3 mmol/L — ABNORMAL LOW (ref 3.5–5.1)
Sodium: 135 mmol/L (ref 135–145)
Total Bilirubin: 0.6 mg/dL (ref 0.3–1.2)
Total Protein: 7.4 g/dL (ref 6.5–8.1)

## 2021-07-07 LAB — POC URINE PREG, ED: Preg Test, Ur: NEGATIVE

## 2021-07-07 LAB — CBC
HCT: 41 % (ref 36.0–46.0)
Hemoglobin: 14.9 g/dL (ref 12.0–15.0)
MCH: 33.3 pg (ref 26.0–34.0)
MCHC: 36.3 g/dL — ABNORMAL HIGH (ref 30.0–36.0)
MCV: 91.5 fL (ref 80.0–100.0)
Platelets: 261 10*3/uL (ref 150–400)
RBC: 4.48 MIL/uL (ref 3.87–5.11)
RDW: 11.9 % (ref 11.5–15.5)
WBC: 11.7 10*3/uL — ABNORMAL HIGH (ref 4.0–10.5)
nRBC: 0 % (ref 0.0–0.2)

## 2021-07-07 LAB — LIPASE, BLOOD: Lipase: 30 U/L (ref 11–51)

## 2021-07-07 MED ORDER — SODIUM CHLORIDE 0.9 % IV BOLUS
1000.0000 mL | Freq: Once | INTRAVENOUS | Status: AC
Start: 2021-07-07 — End: 2021-07-07
  Administered 2021-07-07: 1000 mL via INTRAVENOUS

## 2021-07-07 MED ORDER — CEFTRIAXONE SODIUM 2 G IJ SOLR
2.0000 g | Freq: Once | INTRAMUSCULAR | Status: AC
  Administered 2021-07-07: 2 g via INTRAVENOUS
  Filled 2021-07-07: qty 20

## 2021-07-07 MED ORDER — ONDANSETRON HCL 4 MG/2ML IJ SOLN
4.0000 mg | Freq: Once | INTRAMUSCULAR | Status: AC
  Administered 2021-07-07: 4 mg via INTRAVENOUS
  Filled 2021-07-07: qty 2

## 2021-07-07 MED ORDER — HYDROCODONE-ACETAMINOPHEN 5-325 MG PO TABS
1.0000 | ORAL_TABLET | Freq: Four times a day (QID) | ORAL | 0 refills | Status: AC | PRN
Start: 2021-07-07 — End: 2022-07-07

## 2021-07-07 MED ORDER — HYDROCODONE-ACETAMINOPHEN 5-325 MG PO TABS
2.0000 | ORAL_TABLET | Freq: Once | ORAL | Status: AC
  Administered 2021-07-07: 2 via ORAL
  Filled 2021-07-07: qty 2

## 2021-07-07 MED ORDER — ONDANSETRON 4 MG PO TBDP: 4.0000 mg | ORAL_TABLET | Freq: Three times a day (TID) | ORAL | 0 refills | Status: DC | PRN

## 2021-07-07 MED ORDER — IOHEXOL 350 MG/ML SOLN: 100.0000 mL | Freq: Once | INTRAVENOUS | Status: DC | PRN

## 2021-07-07 MED ORDER — KETOROLAC TROMETHAMINE 15 MG/ML IJ SOLN
15.0000 mg | Freq: Once | INTRAMUSCULAR | Status: AC
  Administered 2021-07-07: 15 mg via INTRAVENOUS
  Filled 2021-07-07: qty 1

## 2021-07-07 MED ORDER — CEFDINIR 300 MG PO CAPS: 300.0000 mg | ORAL_CAPSULE | Freq: Two times a day (BID) | ORAL | 0 refills | Status: AC

## 2021-07-07 MED ORDER — IOHEXOL 300 MG/ML  SOLN
100.0000 mL | Freq: Once | INTRAMUSCULAR | Status: AC | PRN
  Administered 2021-07-07: 100 mL via INTRAVENOUS

## 2021-07-07 NOTE — ED Provider Notes (Signed)
? ?[facility] ?Provider Note ? ? ? Event Date/Time  ? First MD Initiated Contact with Patient 07/07/21 1447   ?  (approximate) ? ? ?History  ? ?No chief complaint on file. ? ? ?HPI ? ?MAXENE STAUDACHER is a 41 y.o. female past medical history of type 2 diabetes who presents with abdominal pain symptoms started on Friday initially with some mid to right upper abdominal pain seem to go away and then since yesterday has seemed to move down more into the right lower quadrant.  Pain is constant but sharp pain does come and go.  Has had multiple episodes of diarrhea.  Denies urinary symptoms including dysuria urgency frequency hematuria.  Pain does radiate to the flank.  No history of kidney stones.  Does have history of cholecystectomy.  Denies fevers chills has been taking ibuprofen for it. ? ?Past Medical History:  ?Diagnosis Date  ? Hives   ? occasional; unknown cause  ? ? ?Patient Active Problem List  ? Diagnosis Date Noted  ? Type 2 diabetes mellitus with hyperglycemia, with long-term current use of insulin ([place]) 12/16/2018  ? ? ? ?Physical Exam  ?Triage Vital Signs: ?ED Triage Vitals  ?Enc Vitals Group  ?   BP 07/07/21 1330 (!) 124/102  ?   Pulse Rate 07/07/21 1330 91  ?   Resp 07/07/21 1330 18  ?   Temp 07/07/21 1330 98.4 ?F (36.9 ?C)  ?   Temp Source 07/07/21 1330 Oral  ?   SpO2 07/07/21 1330 98 %  ?   Weight 07/07/21 1331 178 lb (80.7 kg)  ?   Height 07/07/21 1331 5\' 3"  (1.6 m)  ?   Head Circumference --   ?   Peak Flow --   ?   Pain Score 07/07/21 1331 7  ?   Pain Loc --   ?   Pain Edu? --   ?   Excl. in [place]? --   ? ? ?Most recent vital signs: ?Vitals:  ? 07/07/21 1330  ?BP: (!) 124/102  ?Pulse: 91  ?Resp: 18  ?Temp: 98.4 ?F (36.9 ?C)  ?SpO2: 98%  ? ? ? ?General: Awake, appears mildly uncomfortable ?CV:  Good peripheral perfusion.  ?Resp:  Normal effort.  ?Abd:  No distention. Mild ttp in the RLQ and RUQ and CVA ?Neuro:             Awake, Alert, Oriented x 3  ?Other:   ? ? ?ED Results /  Procedures / Treatments  ?Labs ?(all labs ordered are listed, but only abnormal results are displayed) ?Labs Reviewed  ?COMPREHENSIVE METABOLIC PANEL - Abnormal; Notable for the following components:  ?    Result Value  ? Potassium 3.3 (*)   ? Chloride 97 (*)   ? Glucose, Bld 221 (*)   ? All other components within normal limits  ?CBC - Abnormal; Notable for the following components:  ? WBC 11.7 (*)   ? MCHC 36.3 (*)   ? All other components within normal limits  ?URINALYSIS, ROUTINE W REFLEX MICROSCOPIC - Abnormal; Notable for the following components:  ? Color, Urine YELLOW (*)   ? APPearance HAZY (*)   ? Glucose, UA >=500 (*)   ? Leukocytes,Ua SMALL (*)   ? Bacteria, UA RARE (*)   ? All other components within normal limits  ?LIPASE, BLOOD  ?POC URINE PREG, ED  ? ? ? ?EKG ? ? ? ? ?RADIOLOGY ?Pending at signout ? ? ?PROCEDURES: ? ?  Critical Care performed: No ? ?Procedures ? ?The patient is on the cardiac monitor to evaluate for evidence of arrhythmia and/or significant heart rate changes. ? ? ?MEDICATIONS ORDERED IN ED: ?Medications  ?sodium chloride 0.9 % bolus 1,000 mL (has no administration in time range)  ?ketorolac (TORADOL) 15 MG/ML injection 15 mg (has no administration in time range)  ?ondansetron (ZOFRAN) injection 4 mg (has no administration in time range)  ? ? ? ?IMPRESSION / MDM / ASSESSMENT AND PLAN / ED COURSE  ?I reviewed the triage vital signs and the nursing notes. ?             ?               ? ?Differential diagnosis includes, but is not limited to, pyelonephritis, nephrolithiasis, appendicitis, choledocho ? ?Patient is a 41 year old female presenting with right-sided abdominal pain.  Started mainly right upper but has seemed to progress to the right lower quadrant as well.  Is having diarrhea no fevers mild nausea.  Denies urinary symptoms.  Vital signs within normal limits.  Does appear somewhat uncomfortable on exam.  She does have both right upper and right lower quadrant tenderness.  Has  cholecystectomy and LFTs and lipase are normal so lower suspicion for biliary process.  UA with 11-20 WBCs with rare bacteria but also a lot of squames.  Question significance of this.  We will obtain a CT to rule out appendectomy.  Patient would like to avoid opiates will give her fluids Zofran and Toradol.  Signed out to oncoming [place] pending CT findings. ? ?  ? ? ?FINAL CLINICAL IMPRESSION(S) / ED DIAGNOSES  ? ?Final diagnoses:  ?RLQ abdominal pain  ? ? ? ?Rx / DC Orders  ? ?ED Discharge Orders   ? ? None  ? ?  ? ? ? ?Note:  This document was prepared using Dragon voice recognition software and may include unintentional dictation errors. ?  ?Rada Hay, MD ?07/07/21 1500 ? ?

## 2021-07-07 NOTE — ED Provider Notes (Signed)
Assumed care from Dr. Sidney Ace.  Briefly, patient is here with right-sided flank pain.  CT scan shows no acute abnormality.  Patient does have possible UTI, so we will treat for possible pyelo though she has no significant signs of sepsis or systemic illness.  No vomiting.  She has a mild leukocytosis.  Urine culture sent.  She is tolerating p.o.  Will place on antibiotics and discharged with outpatient follow-up and good return precautions. ?  ?Shaune Pollack, MD ?07/07/21 1726 ? ?

## 2021-07-07 NOTE — ED Triage Notes (Signed)
Pt reports Friday she started to have an upset stomach. Pt reports today she started having sharp RLQ pain that radiates to her back. Pt reports gallbladder was removed but still has appendix. Pt denies hx kidney stones.  ?

## 2021-07-09 LAB — URINE CULTURE

## 2021-11-04 ENCOUNTER — Other Ambulatory Visit: Payer: Self-pay | Admitting: Physician Assistant

## 2021-11-04 DIAGNOSIS — Z1231 Encounter for screening mammogram for malignant neoplasm of breast: Secondary | ICD-10-CM

## 2022-12-10 ENCOUNTER — Other Ambulatory Visit: Payer: Self-pay | Admitting: Physician Assistant

## 2022-12-10 DIAGNOSIS — Z1231 Encounter for screening mammogram for malignant neoplasm of breast: Secondary | ICD-10-CM

## 2023-02-26 NOTE — ED Provider Notes (Signed)
Asked by front office staff to see patient at the desk who is having right lower quadrant abdominal pain that she believes is related to her ovary.  She went to the bathroom and started having discharge from her navel.  Patient needs a higher level of care and likely needs a CT which is not available here.   PE: breathing comfortably in no distress   Katha Cabal, DO    Katha Cabal, DO 02/26/23 1506

## 2023-03-01 ENCOUNTER — Other Ambulatory Visit: Payer: Self-pay | Admitting: Family Medicine

## 2023-03-01 ENCOUNTER — Ambulatory Visit
Admission: RE | Admit: 2023-03-01 | Discharge: 2023-03-01 | Disposition: A | Discharge status: Home / Self Care | Payer: BC Managed Care – PPO | Source: Ambulatory Visit | Attending: Family Medicine | Admitting: Family Medicine

## 2023-03-01 DIAGNOSIS — R1031 Right lower quadrant pain: Secondary | ICD-10-CM

## 2023-03-01 DIAGNOSIS — K5900 Constipation, unspecified: Secondary | ICD-10-CM | POA: Diagnosis present

## 2023-03-01 MED ORDER — IOHEXOL 300 MG/ML  SOLN
100.0000 mL | Freq: Once | INTRAMUSCULAR | Status: AC | PRN
  Administered 2023-03-01: 100 mL via INTRAVENOUS

## 2023-03-15 ENCOUNTER — Other Ambulatory Visit: Payer: Self-pay | Admitting: Physician Assistant

## 2023-03-15 ENCOUNTER — Ambulatory Visit
Admission: RE | Admit: 2023-03-15 | Discharge: 2023-03-15 | Disposition: A | Discharge status: Home / Self Care | Payer: BC Managed Care – PPO | Source: Ambulatory Visit | Attending: Physician Assistant | Admitting: Physician Assistant

## 2023-03-15 DIAGNOSIS — M5416 Radiculopathy, lumbar region: Secondary | ICD-10-CM | POA: Insufficient documentation

## 2023-03-19 ENCOUNTER — Ambulatory Visit
Admission: RE | Admit: 2023-03-19 | Discharge: 2023-03-19 | Disposition: A | Discharge status: Home / Self Care | Payer: 59 | Source: Ambulatory Visit | Attending: Physician Assistant | Admitting: Physician Assistant

## 2023-03-19 DIAGNOSIS — Z1231 Encounter for screening mammogram for malignant neoplasm of breast: Secondary | ICD-10-CM | POA: Insufficient documentation

## 2023-04-14 ENCOUNTER — Encounter: Payer: Self-pay | Admitting: Obstetrics and Gynecology

## 2023-04-14 ENCOUNTER — Ambulatory Visit: Payer: 59 | Admitting: Obstetrics and Gynecology

## 2023-04-14 VITALS — BP 123/87 | HR 94 | Ht 63.0 in | Wt 172.8 lb

## 2023-04-14 DIAGNOSIS — Z7689 Persons encountering health services in other specified circumstances: Secondary | ICD-10-CM

## 2023-04-14 DIAGNOSIS — R102 Pelvic and perineal pain: Secondary | ICD-10-CM

## 2023-04-14 DIAGNOSIS — N83202 Unspecified ovarian cyst, left side: Secondary | ICD-10-CM | POA: Diagnosis not present

## 2023-04-14 NOTE — Progress Notes (Signed)
HPI:      [name] is a 43 y.o. G4P3 who LMP was No LMP recorded. (Menstrual status: IUD).  Subjective:   She presents today because she has been having intermittent pelvic pain for several months.  Her history is that she had heavy bleeding and cramping several years ago for multiple months and received an IUD.  This has made a huge difference in her bleeding and cramping.  In December her midline pelvic pain became worse and she had an ultrasound showing a small ovarian cyst.  A follow-up ultrasound confirmed the appearance of the cyst at approximately 4 cm.  She subsequently has had another ultrasound which shows complete resolution of the cyst.  She continues to have intermittent midline pelvic pain which is at times disabling although she does not miss work with it.  She says nothing particularly helps resolve the pain. She has a tubal ligation for birth control and a Liletta IUD in place for the bleeding.    Hx: The following portions of the patient's history were reviewed and updated as appropriate:             She  has a past medical history of Hives. She does not have any pertinent problems on file. She  has a past surgical history that includes Tubal ligation (03/16/2008); Cholecystectomy (10/20/2010); and Carpal tunnel release (Left). Her family history includes Hypertension in her mother. She  reports that she has been smoking cigarettes. She has never used smokeless tobacco. She reports that she does not drink alcohol and does not use drugs. She has a current medication list which includes the following prescription(s): atorvastatin calcium, one touch ultra 2, basaglar kwikpen, valsartan-hydrochlorothiazide, and ondansetron. She has no known allergies.       Review of Systems:  Review of Systems  Constitutional: Denied constitutional symptoms, night sweats, recent illness, fatigue, fever, insomnia and weight loss.  Eyes: Denied eye symptoms, eye pain, photophobia, vision  change and visual disturbance.  Ears/Nose/Throat/Neck: Denied ear, nose, throat or neck symptoms, hearing loss, nasal discharge, sinus congestion and sore throat.  Cardiovascular: Denied cardiovascular symptoms, arrhythmia, chest pain/pressure, edema, exercise intolerance, orthopnea and palpitations.  Respiratory: Denied pulmonary symptoms, asthma, pleuritic pain, productive sputum, cough, dyspnea and wheezing.  Gastrointestinal: Denied, gastro-esophageal reflux, melena, nausea and vomiting.  Genitourinary: See HPI for additional information.  Musculoskeletal: Denied musculoskeletal symptoms, stiffness, swelling, muscle weakness and myalgia.  Dermatologic: Denied dermatology symptoms, rash and scar.  Neurologic: Denied neurology symptoms, dizziness, headache, neck pain and syncope.  Psychiatric: Denied psychiatric symptoms, anxiety and depression.  Endocrine: Denied endocrine symptoms including hot flashes and night sweats.   Meds:   Current Outpatient Medications on File Prior to Visit  Medication Sig Dispense Refill   ATORVASTATIN CALCIUM PO Take by mouth.     Blood Glucose Monitoring Suppl (ONE TOUCH ULTRA 2) w/Device KIT Use to check blood sugar  0   Insulin Glargine (BASAGLAR KWIKPEN) 100 UNIT/ML SOPN 22 Units at bedtime.     valsartan-hydrochlorothiazide (DIOVAN-HCT) 320-25 MG tablet TAKE 1 TABLET BY MOUTH EVERY DAY     ondansetron (ZOFRAN-ODT) 4 MG disintegrating tablet Take 1 tablet (4 mg total) by mouth every 8 (eight) hours as needed for nausea or vomiting. (Patient not taking: Reported on 04/14/2023) 20 tablet 0   No current facility-administered medications on file prior to visit.      Objective:     There were no vitals filed for this visit. Filed Weights   04/14/23 1347  Weight: 172 lb 12.8 oz (78.4 kg)              Ultrasound results reviewed which seem to show a heterogeneous myometrium.          Assessment:    G4P3 Patient Active Problem List   Diagnosis  Date Noted   Type 2 diabetes mellitus with hyperglycemia, with long-term current use of insulin ([facility]) 12/16/2018     1. Establishing care with new doctor, encounter for   2. Pelvic pain   3. Left ovarian cyst     Patient with midline pelvic pain likely arising from her uterus that in combination with her ultrasound is most consistent with adenomyosis.  She has a Liletta IUD in place which should be helping but it has obviously not resolved her symptoms.   Plan:            1.  We discussed adenomyosis in detail.  The use of IUD with adenomyosis was also discussed.  Definitive management and surgical diagnosis of adenomyosis discussed and patient would possibly consider hysterectomy in the future.  She plans to undergo expectant management over the next several months and decide whether definitive surgery of hysterectomy would be worth it to her.  I have reassured her about the resolution of the ovarian cyst.  2.  As she is behind on Pap smears and exams, I have asked her to make a health maintenance exam in the near future. Orders No orders of the defined types were placed in this encounter.   No orders of the defined types were placed in this encounter.     F/U  Return for Annual Physical.  Elonda Husky, M.D. 04/14/2023 2:48 PM

## 2023-04-14 NOTE — Progress Notes (Signed)
Patient presents today to discuss pelvic pain. She reports pain starting before christmas, CT showed ovarian cyst in December. Ultrasound earlier this week shows cyst has resolved but she is still in pain.

## 2023-04-28 ENCOUNTER — Encounter: Payer: 59 | Admitting: Obstetrics

## 2023-04-28 ENCOUNTER — Other Ambulatory Visit (HOSPITAL_COMMUNITY)
Admission: RE | Admit: 2023-04-28 | Discharge: 2023-04-28 | Disposition: A | Discharge status: Home / Self Care | Payer: 59 | Source: Ambulatory Visit | Attending: Obstetrics and Gynecology | Admitting: Obstetrics and Gynecology

## 2023-04-28 ENCOUNTER — Ambulatory Visit (INDEPENDENT_AMBULATORY_CARE_PROVIDER_SITE_OTHER): Payer: 59 | Admitting: Obstetrics and Gynecology

## 2023-04-28 ENCOUNTER — Encounter: Payer: Self-pay | Admitting: Obstetrics and Gynecology

## 2023-04-28 VITALS — BP 121/79 | HR 144 | Ht 63.0 in | Wt 174.1 lb

## 2023-04-28 DIAGNOSIS — R102 Pelvic and perineal pain: Secondary | ICD-10-CM

## 2023-04-28 DIAGNOSIS — Z01419 Encounter for gynecological examination (general) (routine) without abnormal findings: Secondary | ICD-10-CM

## 2023-04-28 DIAGNOSIS — Z124 Encounter for screening for malignant neoplasm of cervix: Secondary | ICD-10-CM | POA: Diagnosis present

## 2023-04-28 DIAGNOSIS — Z30431 Encounter for routine checking of intrauterine contraceptive device: Secondary | ICD-10-CM

## 2023-04-28 NOTE — Progress Notes (Signed)
Patients presents for annual exam today. She states her pelvic pain is still daily and severe, lately has been losing sleep due to constant stabbing feeling.. Due for pap smear, ordered. Annual labs and mammography are up to date. She states no other questions or concerns at this time.

## 2023-04-28 NOTE — Progress Notes (Signed)
HPI:      Brianna Berger is a 43 y.o. G4P3 who LMP was No LMP recorded. (Menstrual status: IUD).  Subjective:   She presents today for her annual examination.  She continues to have midline pelvic pain.  She says the last few days have been worse.  She reports no bleeding and she does have an IUD in place.  She says that after getting her IUD her pain and bleeding significantly decreased about seems to be almost as bad as when she was having menses. Previous imaging shows no issues with her pelvis and IUD is correctly positioned.    Hx: The following portions of the patient's history were reviewed and updated as appropriate:             She  has a past medical history of Hives. She does not have any pertinent problems on file. She  has a past surgical history that includes Tubal ligation (03/16/2008); Cholecystectomy (10/20/2010); and Carpal tunnel release (Left). Her family history includes Hypertension in her mother. She  reports that she has been smoking cigarettes. She has never used smokeless tobacco. She reports that she does not drink alcohol and does not use drugs. She has a current medication list which includes the following prescription(s): atorvastatin calcium, one touch ultra 2, basaglar kwikpen, levonorgestrel, ondansetron, and valsartan-hydrochlorothiazide. She has no known allergies.       Review of Systems:  Review of Systems  Constitutional: Denied constitutional symptoms, night sweats, recent illness, fatigue, fever, insomnia and weight loss.  Eyes: Denied eye symptoms, eye pain, photophobia, vision change and visual disturbance.  Ears/Nose/Throat/Neck: Denied ear, nose, throat or neck symptoms, hearing loss, nasal discharge, sinus congestion and sore throat.  Cardiovascular: Denied cardiovascular symptoms, arrhythmia, chest pain/pressure, edema, exercise intolerance, orthopnea and palpitations.  Respiratory: Denied pulmonary symptoms, asthma, pleuritic pain,  productive sputum, cough, dyspnea and wheezing.  Gastrointestinal: Denied, gastro-esophageal reflux, melena, nausea and vomiting.  Genitourinary: See HPI for additional information.  Musculoskeletal: Denied musculoskeletal symptoms, stiffness, swelling, muscle weakness and myalgia.  Dermatologic: Denied dermatology symptoms, rash and scar.  Neurologic: Denied neurology symptoms, dizziness, headache, neck pain and syncope.  Psychiatric: Denied psychiatric symptoms, anxiety and depression.  Endocrine: Denied endocrine symptoms including hot flashes and night sweats.   Meds:   Current Outpatient Medications on File Prior to Visit  Medication Sig Dispense Refill   ATORVASTATIN CALCIUM PO Take by mouth.     Blood Glucose Monitoring Suppl (ONE TOUCH ULTRA 2) w/Device KIT Use to check blood sugar  0   Insulin Glargine (BASAGLAR KWIKPEN) 100 UNIT/ML SOPN 22 Units at bedtime.     Levonorgestrel (LILETTA, 52 MG, IU) by Intrauterine route.     ondansetron (ZOFRAN-ODT) 4 MG disintegrating tablet Take 1 tablet (4 mg total) by mouth every 8 (eight) hours as needed for nausea or vomiting. 20 tablet 0   valsartan-hydrochlorothiazide (DIOVAN-HCT) 320-25 MG tablet TAKE 1 TABLET BY MOUTH EVERY DAY     No current facility-administered medications on file prior to visit.     Objective:     Vitals:   04/28/23 1320  BP: 121/79  Pulse: (!) 144    Filed Weights   04/28/23 1320  Weight: 174 lb 1.6 oz (79 kg)              Physical examination Patient deferred breast exam.   Pelvic:   Vulva: Normal appearance.  No lesions.  Vagina: No lesions or abnormalities noted.  Support: Normal pelvic support.  Urethra No masses tenderness or scarring.  Meatus Normal size without lesions or prolapse.  Cervix: Normal appearance.  No lesions.  Cervix deviated to the right IUD strings noted  Anus: Normal exam.  No lesions.  Perineum: Normal exam.  No lesions.        Bimanual   Uterus: Normal size.  Non-tender.   Mobile.  AV.  Adnexae: No masses.  Non-tender to palpation.  Cul-de-sac: Negative for abnormality.     Assessment:    G4P3 Patient Active Problem List   Diagnosis Date Noted   Type 2 diabetes mellitus with hyperglycemia, with long-term current use of insulin ([facility]) 12/16/2018     1. Well woman exam with routine gynecological exam   2. Cervical cancer screening   3. Pelvic pain   4. Encounter for routine checking of intrauterine contraceptive device (IUD)     She has continued pelvic pain which is even somewhat worse recently.  She is strongly considering surgery for this issue. Does not seem to have bowel issues or any hernia issues.  I would like to have better confirmation of the cause of her pelvic pain before hysterectomy if possible.  We discussed the possibility of interstitial cystitis.   Plan:            1.  Basic Screening Recommendations The basic screening recommendations for asymptomatic women were discussed with the patient during her visit.  The age-appropriate recommendations were discussed with her and the rational for the tests reviewed.  When I am informed by the patient that another primary care physician has previously obtained the age-appropriate tests and they are up-to-date, only outstanding tests are ordered and referrals given as necessary.  Abnormal results of tests will be discussed with her when all of her results are completed.  Routine preventative health maintenance measures emphasized: Exercise/Diet/Weight control, Tobacco Warnings, Alcohol/Substance use risks and Stress Management Pap performed -mammogram was in January 2.  Patient to research interstitial cystitis and to consider workup with urology prior to hysterectomy.  Orders No orders of the defined types were placed in this encounter.   No orders of the defined types were placed in this encounter.       F/U  No follow-ups on file.  Elonda Husky, M.D. 04/28/2023 1:37 PM

## 2023-04-29 ENCOUNTER — Encounter: Payer: Self-pay | Admitting: Obstetrics and Gynecology

## 2023-05-03 LAB — CYTOLOGY - PAP
Comment: NEGATIVE
High risk HPV: NEGATIVE

## 2023-05-19 ENCOUNTER — Encounter: Payer: Self-pay | Admitting: Obstetrics and Gynecology

## 2023-05-19 ENCOUNTER — Ambulatory Visit (INDEPENDENT_AMBULATORY_CARE_PROVIDER_SITE_OTHER): Payer: 59 | Admitting: Obstetrics and Gynecology

## 2023-05-19 VITALS — BP 114/82 | HR 97 | Ht 63.0 in | Wt 177.8 lb

## 2023-05-19 DIAGNOSIS — R102 Pelvic and perineal pain: Secondary | ICD-10-CM | POA: Diagnosis not present

## 2023-05-19 DIAGNOSIS — Z01818 Encounter for other preprocedural examination: Secondary | ICD-10-CM | POA: Diagnosis not present

## 2023-05-19 MED ORDER — METRONIDAZOLE 500 MG PO TABS
500.0000 mg | ORAL_TABLET | Freq: Two times a day (BID) | ORAL | 0 refills | Status: DC
Start: 2023-05-19 — End: 2023-06-07

## 2023-05-19 NOTE — Progress Notes (Signed)
Patient presents today for a pre-op exam prior to hysterectomy. She states no additional concerns today.

## 2023-05-19 NOTE — H&P (View-Only) (Signed)
 PRE-OPERATIVE HISTORY AND PHYSICAL EXAM  PCP:  Patrice Paradise, MD Subjective:   HPI:  Brianna Berger is a 43 y.o. G4P3.  No LMP recorded. (Menstrual status: IUD).  She presents today for a pre-op discussion and PE.  She has the following symptoms: Pelvic pain despite IUD.  History of ovarian cysts.  Possible adenomyosis.  Review of Systems:   Constitutional: Denied constitutional symptoms, night sweats, recent illness, fatigue, fever, insomnia and weight loss.  Eyes: Denied eye symptoms, eye pain, photophobia, vision change and visual disturbance.  Ears/Nose/Throat/Neck: Denied ear, nose, throat or neck symptoms, hearing loss, nasal discharge, sinus congestion and sore throat.  Cardiovascular: Denied cardiovascular symptoms, arrhythmia, chest pain/pressure, edema, exercise intolerance, orthopnea and palpitations.  Respiratory: Denied pulmonary symptoms, asthma, pleuritic pain, productive sputum, cough, dyspnea and wheezing.  Gastrointestinal: Denied, gastro-esophageal reflux, melena, nausea and vomiting.  Genitourinary: See HPI for additional information.  Musculoskeletal: Denied musculoskeletal symptoms, stiffness, swelling, muscle weakness and myalgia.  Dermatologic: Denied dermatology symptoms, rash and scar.  Neurologic: Denied neurology symptoms, dizziness, headache, neck pain and syncope.  Psychiatric: Denied psychiatric symptoms, anxiety and depression.  Endocrine: Denied endocrine symptoms including hot flashes and night sweats.   OB History  Gravida Para Term Preterm AB Living  4 3      SAB IAB Ectopic Multiple Live Births          # Outcome Date GA Lbr Len/2nd Weight Sex Type Anes PTL Lv  4 Gravida           3 Para           2 Para           1 Para             Past Medical History:  Diagnosis Date   Hives    occasional; unknown cause    Past Surgical History:  Procedure Laterality Date   CARPAL TUNNEL RELEASE Left    CHOLECYSTECTOMY  10/20/2010    Procedure: LAPAROSCOPIC CHOLECYSTECTOMY;  Surgeon: Marlane Hatcher;  Location: [facility];  Service: General;  Laterality: N/A;   TUBAL LIGATION  03/16/2008   [facility] [place]      SOCIAL HISTORY:  Social History   Tobacco Use  Smoking Status Every Day   Types: Cigarettes  Smokeless Tobacco Never   Social History   Substance and Sexual Activity  Alcohol Use No    Social History   Substance and Sexual Activity  Drug Use No    Family History  Problem Relation Age of Onset   Hypertension Mother    Anesthesia problems Neg Hx     ALLERGIES:  Patient has no known allergies.  MEDS:   Current Outpatient Medications on File Prior to Visit  Medication Sig Dispense Refill   ATORVASTATIN CALCIUM PO Take by mouth.     Blood Glucose Monitoring Suppl (ONE TOUCH ULTRA 2) w/Device KIT Use to check blood sugar  0   Insulin Glargine (BASAGLAR KWIKPEN) 100 UNIT/ML SOPN 22 Units at bedtime.     Levonorgestrel (LILETTA, 52 MG, IU) by Intrauterine route.     ondansetron (ZOFRAN-ODT) 4 MG disintegrating tablet Take 1 tablet (4 mg total) by mouth every 8 (eight) hours as needed for nausea or vomiting. 20 tablet 0   valsartan-hydrochlorothiazide (DIOVAN-HCT) 320-25 MG tablet TAKE 1 TABLET BY MOUTH EVERY DAY     No current facility-administered medications on file prior to visit.    Meds ordered this encounter  Medications   metroNIDAZOLE (FLAGYL) 500 MG tablet    Sig: Take 1 tablet (500 mg total) by mouth 2 (two) times daily. Begin 5 days prior to scheduled surgery as directed.    Dispense:  10 tablet    Refill:  0     Physical examination BP 114/82   Pulse 97   Ht 5\' 3"  (1.6 m)   Wt 177 lb 12.8 oz (80.6 kg)   BMI 31.50 kg/m   General NAD, Conversant  HEENT Atraumatic; Op clear with mmm.  Normo-cephalic.  Anicteric sclerae  Thyroid/Neck Smooth without nodularity or enlargement. Normal ROM.  Neck Supple.  Skin No rashes, lesions or ulceration. Normal palpated skin turgor.  No nodularity.  Breasts: No masses or discharge.  Symmetric.  No axillary adenopathy.  Lungs: Clear to auscultation.No rales or wheezes. Normal Respiratory effort, no retractions.  Heart: NSR.  No murmurs or rubs appreciated. No peripheral edema  Abdomen: Soft.  Non-tender.  No masses.  No HSM. No hernia  Extremities: Moves all appropriately.  Normal ROM for age. No lymphadenopathy.  Neuro: Oriented to PPT.  Normal mood. Normal affect.   Pelvic:    Vulva: Normal appearance.  No lesions.   Vagina: No lesions or abnormalities noted.   Support: Normal pelvic support.   Urethra No masses tenderness or scarring.   Meatus Normal size without lesions or prolapse.   Cervix: Normal appearance.  No lesions.  Cervix deviated to the right IUD strings noted   Anus: Normal exam.  No lesions.   Perineum: Normal exam.  No lesions.         Bimanual    Uterus: Normal size.  Non-tender.  Mobile.  AV.    Adnexae: No masses.  Non-tender to palpation.    Cul-de-sac: Negative for abnormality.     Assessment:   G4P3 Patient Active Problem List   Diagnosis Date Noted   Type 2 diabetes mellitus with hyperglycemia, with long-term current use of insulin (HCC) 12/16/2018    1. Pre-op exam   2. Pelvic pain      Patient has failed IUD for pelvic pain.  Plan:   Orders: Meds ordered this encounter  Medications   metroNIDAZOLE (FLAGYL) 500 MG tablet    Sig: Take 1 tablet (500 mg total) by mouth 2 (two) times daily. Begin 5 days prior to scheduled surgery as directed.    Dispense:  10 tablet    Refill:  0     1.  LAVH BSO

## 2023-05-19 NOTE — H&P (Signed)
 PRE-OPERATIVE HISTORY AND PHYSICAL EXAM  PCP:  Patrice Paradise, MD Subjective:   HPI:  Brianna Berger is a 43 y.o. G4P3.  No LMP recorded. (Menstrual status: IUD).  She presents today for a pre-op discussion and PE.  She has the following symptoms: Pelvic pain despite IUD.  History of ovarian cysts.  Possible adenomyosis.  Review of Systems:   Constitutional: Denied constitutional symptoms, night sweats, recent illness, fatigue, fever, insomnia and weight loss.  Eyes: Denied eye symptoms, eye pain, photophobia, vision change and visual disturbance.  Ears/Nose/Throat/Neck: Denied ear, nose, throat or neck symptoms, hearing loss, nasal discharge, sinus congestion and sore throat.  Cardiovascular: Denied cardiovascular symptoms, arrhythmia, chest pain/pressure, edema, exercise intolerance, orthopnea and palpitations.  Respiratory: Denied pulmonary symptoms, asthma, pleuritic pain, productive sputum, cough, dyspnea and wheezing.  Gastrointestinal: Denied, gastro-esophageal reflux, melena, nausea and vomiting.  Genitourinary: See HPI for additional information.  Musculoskeletal: Denied musculoskeletal symptoms, stiffness, swelling, muscle weakness and myalgia.  Dermatologic: Denied dermatology symptoms, rash and scar.  Neurologic: Denied neurology symptoms, dizziness, headache, neck pain and syncope.  Psychiatric: Denied psychiatric symptoms, anxiety and depression.  Endocrine: Denied endocrine symptoms including hot flashes and night sweats.   OB History  Gravida Para Term Preterm AB Living  4 3      SAB IAB Ectopic Multiple Live Births          # Outcome Date GA Lbr Len/2nd Weight Sex Type Anes PTL Lv  4 Gravida           3 Para           2 Para           1 Para             Past Medical History:  Diagnosis Date   Hives    occasional; unknown cause    Past Surgical History:  Procedure Laterality Date   CARPAL TUNNEL RELEASE Left    CHOLECYSTECTOMY  10/20/2010    Procedure: LAPAROSCOPIC CHOLECYSTECTOMY;  Surgeon: Marlane Hatcher;  Location: [facility];  Service: General;  Laterality: N/A;   TUBAL LIGATION  03/16/2008   [facility] [place]      SOCIAL HISTORY:  Social History   Tobacco Use  Smoking Status Every Day   Types: Cigarettes  Smokeless Tobacco Never   Social History   Substance and Sexual Activity  Alcohol Use No    Social History   Substance and Sexual Activity  Drug Use No    Family History  Problem Relation Age of Onset   Hypertension Mother    Anesthesia problems Neg Hx     ALLERGIES:  Patient has no known allergies.  MEDS:   Current Outpatient Medications on File Prior to Visit  Medication Sig Dispense Refill   ATORVASTATIN CALCIUM PO Take by mouth.     Blood Glucose Monitoring Suppl (ONE TOUCH ULTRA 2) w/Device KIT Use to check blood sugar  0   Insulin Glargine (BASAGLAR KWIKPEN) 100 UNIT/ML SOPN 22 Units at bedtime.     Levonorgestrel (LILETTA, 52 MG, IU) by Intrauterine route.     ondansetron (ZOFRAN-ODT) 4 MG disintegrating tablet Take 1 tablet (4 mg total) by mouth every 8 (eight) hours as needed for nausea or vomiting. 20 tablet 0   valsartan-hydrochlorothiazide (DIOVAN-HCT) 320-25 MG tablet TAKE 1 TABLET BY MOUTH EVERY DAY     No current facility-administered medications on file prior to visit.    Meds ordered this encounter  Medications   metroNIDAZOLE (FLAGYL) 500 MG tablet    Sig: Take 1 tablet (500 mg total) by mouth 2 (two) times daily. Begin 5 days prior to scheduled surgery as directed.    Dispense:  10 tablet    Refill:  0     Physical examination BP 114/82   Pulse 97   Ht 5\' 3"  (1.6 m)   Wt 177 lb 12.8 oz (80.6 kg)   BMI 31.50 kg/m   General NAD, Conversant  HEENT Atraumatic; Op clear with mmm.  Normo-cephalic.  Anicteric sclerae  Thyroid/Neck Smooth without nodularity or enlargement. Normal ROM.  Neck Supple.  Skin No rashes, lesions or ulceration. Normal palpated skin turgor.  No nodularity.  Breasts: No masses or discharge.  Symmetric.  No axillary adenopathy.  Lungs: Clear to auscultation.No rales or wheezes. Normal Respiratory effort, no retractions.  Heart: NSR.  No murmurs or rubs appreciated. No peripheral edema  Abdomen: Soft.  Non-tender.  No masses.  No HSM. No hernia  Extremities: Moves all appropriately.  Normal ROM for age. No lymphadenopathy.  Neuro: Oriented to PPT.  Normal mood. Normal affect.   Pelvic:    Vulva: Normal appearance.  No lesions.   Vagina: No lesions or abnormalities noted.   Support: Normal pelvic support.   Urethra No masses tenderness or scarring.   Meatus Normal size without lesions or prolapse.   Cervix: Normal appearance.  No lesions.  Cervix deviated to the right IUD strings noted   Anus: Normal exam.  No lesions.   Perineum: Normal exam.  No lesions.         Bimanual    Uterus: Normal size.  Non-tender.  Mobile.  AV.    Adnexae: No masses.  Non-tender to palpation.    Cul-de-sac: Negative for abnormality.     Assessment:   G4P3 Patient Active Problem List   Diagnosis Date Noted   Type 2 diabetes mellitus with hyperglycemia, with long-term current use of insulin (HCC) 12/16/2018    1. Pre-op exam   2. Pelvic pain      Patient has failed IUD for pelvic pain.  Plan:   Orders: Meds ordered this encounter  Medications   metroNIDAZOLE (FLAGYL) 500 MG tablet    Sig: Take 1 tablet (500 mg total) by mouth 2 (two) times daily. Begin 5 days prior to scheduled surgery as directed.    Dispense:  10 tablet    Refill:  0     1.  LAVH BSO

## 2023-05-19 NOTE — Progress Notes (Signed)
 PRE-OPERATIVE HISTORY AND PHYSICAL EXAM  PCP:  Patrice Paradise, MD Subjective:   HPI:  Brianna Berger is a 43 y.o. G4P3.  No LMP recorded. (Menstrual status: IUD).  She presents today for a pre-op discussion and PE.  She has the following symptoms: Pelvic pain despite IUD.  History of ovarian cysts.  Possible adenomyosis.  Review of Systems:   Constitutional: Denied constitutional symptoms, night sweats, recent illness, fatigue, fever, insomnia and weight loss.  Eyes: Denied eye symptoms, eye pain, photophobia, vision change and visual disturbance.  Ears/Nose/Throat/Neck: Denied ear, nose, throat or neck symptoms, hearing loss, nasal discharge, sinus congestion and sore throat.  Cardiovascular: Denied cardiovascular symptoms, arrhythmia, chest pain/pressure, edema, exercise intolerance, orthopnea and palpitations.  Respiratory: Denied pulmonary symptoms, asthma, pleuritic pain, productive sputum, cough, dyspnea and wheezing.  Gastrointestinal: Denied, gastro-esophageal reflux, melena, nausea and vomiting.  Genitourinary: See HPI for additional information.  Musculoskeletal: Denied musculoskeletal symptoms, stiffness, swelling, muscle weakness and myalgia.  Dermatologic: Denied dermatology symptoms, rash and scar.  Neurologic: Denied neurology symptoms, dizziness, headache, neck pain and syncope.  Psychiatric: Denied psychiatric symptoms, anxiety and depression.  Endocrine: Denied endocrine symptoms including hot flashes and night sweats.   OB History  Gravida Para Term Preterm AB Living  4 3      SAB IAB Ectopic Multiple Live Births          # Outcome Date GA Lbr Len/2nd Weight Sex Type Anes PTL Lv  4 Gravida           3 Para           2 Para           1 Para             Past Medical History:  Diagnosis Date   Hives    occasional; unknown cause    Past Surgical History:  Procedure Laterality Date   CARPAL TUNNEL RELEASE Left    CHOLECYSTECTOMY  10/20/2010    Procedure: LAPAROSCOPIC CHOLECYSTECTOMY;  Surgeon: Marlane Hatcher;  Location: [facility];  Service: General;  Laterality: N/A;   TUBAL LIGATION  03/16/2008   [facility]      SOCIAL HISTORY:  Social History   Tobacco Use  Smoking Status Every Day   Types: Cigarettes  Smokeless Tobacco Never   Social History   Substance and Sexual Activity  Alcohol Use No    Social History   Substance and Sexual Activity  Drug Use No    Family History  Problem Relation Age of Onset   Hypertension Mother    Anesthesia problems Neg Hx     ALLERGIES:  Patient has no known allergies.  MEDS:   Current Outpatient Medications on File Prior to Visit  Medication Sig Dispense Refill   ATORVASTATIN CALCIUM PO Take by mouth.     Blood Glucose Monitoring Suppl (ONE TOUCH ULTRA 2) w/Device KIT Use to check blood sugar  0   Insulin Glargine (BASAGLAR KWIKPEN) 100 UNIT/ML SOPN 22 Units at bedtime.     Levonorgestrel (LILETTA, 52 MG, IU) by Intrauterine route.     ondansetron (ZOFRAN-ODT) 4 MG disintegrating tablet Take 1 tablet (4 mg total) by mouth every 8 (eight) hours as needed for nausea or vomiting. 20 tablet 0   valsartan-hydrochlorothiazide (DIOVAN-HCT) 320-25 MG tablet TAKE 1 TABLET BY MOUTH EVERY DAY     No current facility-administered medications on file prior to visit.    Meds ordered this encounter  Medications   metroNIDAZOLE (FLAGYL) 500 MG tablet    Sig: Take 1 tablet (500 mg total) by mouth 2 (two) times daily. Begin 5 days prior to scheduled surgery as directed.    Dispense:  10 tablet    Refill:  0     Physical examination BP 114/82   Pulse 97   Ht 5\' 3"  (1.6 m)   Wt 177 lb 12.8 oz (80.6 kg)   BMI 31.50 kg/m   General NAD, Conversant  HEENT Atraumatic; Op clear with mmm.  Normo-cephalic.  Anicteric sclerae  Thyroid/Neck Smooth without nodularity or enlargement. Normal ROM.  Neck Supple.  Skin No rashes, lesions or ulceration. Normal palpated skin turgor.  No nodularity.  Breasts: No masses or discharge.  Symmetric.  No axillary adenopathy.  Lungs: Clear to auscultation.No rales or wheezes. Normal Respiratory effort, no retractions.  Heart: NSR.  No murmurs or rubs appreciated. No peripheral edema  Abdomen: Soft.  Non-tender.  No masses.  No HSM. No hernia  Extremities: Moves all appropriately.  Normal ROM for age. No lymphadenopathy.  Neuro: Oriented to PPT.  Normal mood. Normal affect.   Pelvic:    Vulva: Normal appearance.  No lesions.   Vagina: No lesions or abnormalities noted.   Support: Normal pelvic support.   Urethra No masses tenderness or scarring.   Meatus Normal size without lesions or prolapse.   Cervix: Normal appearance.  No lesions.  Cervix deviated to the right IUD strings noted   Anus: Normal exam.  No lesions.   Perineum: Normal exam.  No lesions.         Bimanual    Uterus: Normal size.  Non-tender.  Mobile.  AV.    Adnexae: No masses.  Non-tender to palpation.    Cul-de-sac: Negative for abnormality.     Assessment:   G4P3 Patient Active Problem List   Diagnosis Date Noted   Type 2 diabetes mellitus with hyperglycemia, with long-term current use of insulin (HCC) 12/16/2018    1. Pre-op exam   2. Pelvic pain      Patient has failed IUD for pelvic pain.  Plan:   Orders: Meds ordered this encounter  Medications   metroNIDAZOLE (FLAGYL) 500 MG tablet    Sig: Take 1 tablet (500 mg total) by mouth 2 (two) times daily. Begin 5 days prior to scheduled surgery as directed.    Dispense:  10 tablet    Refill:  0     1.  LAVH BSO  Pre-op discussions regarding Risks and Benefits of her scheduled surgery.  LAVH The procedure of Laparoscopic Assisted Vaginal Hysterectomy was described to the patient in detail.  We reviewed the rationale for Hysterectomy and the patient was again informed of other nonsurgical management possibilities for her condition.  She has considered these other options, and desires a  Hysterectomy.  We have reviewed the fact that Hysterectomy is permanent and that following the procedure she will not be able to become pregnant or bear children.  We have discussed the following risk factors specifically and the patient has also been informed that additional complications not mentioned may develop:  Damage to bowel, bladder, ureters or to other internal organs, bleeding, infection and the risk from anesthesia.  We have discussed the procedure itself in detail and she has an informed understanding of this surgery.  We have also discussed the recovery period in which physical and sexual activity will be restricted for a varying degree of time, often 3 - 6  weeks. The Laparoscopic Portion of Hysterectomy has also been reviewed with the patient.  She understands how the laparoscope facilitates the procedure.  We have discussed the abdominal incisions and punctures that will be used.  We have also reviewed the increased Operating Room time often accompanying LAVH.  The slightly increased risk of complications secondary to abdominal punctures, and use of laparoscopic instrumentation has also been discussed in detail.I have answered all of her questions and I believe the patient has an informed understanding of the procedure of Laparoscopic Assisted Vaginal Hysterectomy. Oophorectomy The option of Oophorectomy has been discussed with the patient.  Detailed risk/benefits have been reviewed.  The risks discussed include, but are not limited to, hemorrhage, infection, damage to ureter or other internal organ, and Ovarian Remnant Syndrome.  The benefits include a significant decrease in the risk of Ovarian Cancer and in benign Ovarian disease.  The risk of Ovarian CA has been estimated at 1 in 70.  This is a relatively small risk.  However, should Ovarian CA develop, it is often found late in the course of the disease.  We have also discussed the role of inheritance in the development of Ovarian disease.   Some women, who have close relatives with Ovarian CA, have a higher than 1 in 70 risk of Ovarian CA.  The benefits of Estrogen replacement therapy following Oophorectomy has been stressed.  If she is premenopausal, we have discussed the fact that this procedure will make her permanently sterile and that premature menopause will result if no ERT is begun.  I have answered all of her questions, and I believe that she has an adequate and informed understanding of the risks and benefits of Oophorectomy. She has elected to have bilateral oophorectomy.  She states that she has had history of ovarian cysts and does not want to have pain from these in the future.  She understands that this will require her to begin ERT postop.  Elonda Husky, M.D. 05/19/2023 10:15 AM

## 2023-05-31 ENCOUNTER — Encounter
Admission: RE | Admit: 2023-05-31 | Discharge: 2023-05-31 | Disposition: A | Discharge status: Home / Self Care | Source: Ambulatory Visit | Attending: Obstetrics and Gynecology | Admitting: Obstetrics and Gynecology

## 2023-05-31 DIAGNOSIS — Z01810 Encounter for preprocedural cardiovascular examination: Secondary | ICD-10-CM

## 2023-05-31 DIAGNOSIS — Z794 Long term (current) use of insulin: Secondary | ICD-10-CM

## 2023-05-31 DIAGNOSIS — Z79899 Other long term (current) drug therapy: Secondary | ICD-10-CM

## 2023-05-31 DIAGNOSIS — Z01812 Encounter for preprocedural laboratory examination: Secondary | ICD-10-CM

## 2023-05-31 DIAGNOSIS — Z01818 Encounter for other preprocedural examination: Secondary | ICD-10-CM

## 2023-05-31 DIAGNOSIS — I10 Essential (primary) hypertension: Secondary | ICD-10-CM

## 2023-05-31 HISTORY — DX: Unspecified asthma, uncomplicated: J45.909

## 2023-05-31 HISTORY — DX: Anemia, unspecified: D64.9

## 2023-05-31 HISTORY — DX: Personal history of other diseases of the female genital tract: Z87.42

## 2023-05-31 HISTORY — DX: Essential (primary) hypertension: I10

## 2023-05-31 HISTORY — DX: Family history of other specified conditions: Z84.89

## 2023-05-31 HISTORY — DX: Type 2 diabetes mellitus without complications: E11.9

## 2023-05-31 HISTORY — DX: Pelvic and perineal pain: R10.2

## 2023-05-31 NOTE — Patient Instructions (Addendum)
 Your procedure is scheduled on:06-07-23 Monday Report to the Registration Desk on the 1st floor of the [facility].Then proceed to the 2nd floor Surgery Desk To find out your arrival time, please call [phone] between 1PM - 3PM on:06-04-23 Friday If your arrival time is 6:00 am, do not arrive before that time as the [facility] entrance doors do not open until 6:00 am.  REMEMBER: Instructions that are not followed completely may result in serious medical risk, up to and including death; or upon the discretion of your surgeon and anesthesiologist your surgery may need to be rescheduled.  Do not eat food OR drink any liquids after midnight the night before surgery.  No gum chewing or hard candies.  One week prior to surgery:Stop NOW (05-31-23) Stop Anti-inflammatories (NSAIDS) such as Advil, Aleve, Ibuprofen, Motrin, Naproxen, Naprosyn and Aspirin based products such as Excedrin, Goody's Powder, BC Powder. Stop ANY OVER THE COUNTER supplements until after surgery (Multivitamin)  You may however, continue to take Tylenol if needed for pain up until the day of surgery (Multivitamin)  Stop Ozempic 7 days prior to surgery-Do NOT take again until AFTER surgery  Continue taking all of your other prescription medications up until the day of surgery.  Do NOT take any medication the day of surgery  Use your Albuterol Inhaler the day of surgery and bring your Inhaler to the hospital  Take half of your LANTUS Insulin (16 units) the night before surgery and NO Insulin the morning of surgery  No Alcohol for 24 hours before or after surgery.  No Smoking including e-cigarettes for 24 hours before surgery.  No chewable tobacco products for at least 6 hours before surgery.  No nicotine patches on the day of surgery.  Do not use any "recreational" drugs for at least a week (preferably 2 weeks) before your surgery.  Please be advised that the combination of cocaine and anesthesia may have negative  outcomes, up to and including death. If you test positive for cocaine, your surgery will be cancelled.  On the morning of surgery brush your teeth with toothpaste and water, you may rinse your mouth with mouthwash if you wish. Do not swallow any toothpaste or mouthwash.  Use CHG Soap as directed on instruction sheet.  Do not wear jewelry, make-up, hairpins, clips or nail polish.  For welded (permanent) jewelry: bracelets, anklets, waist bands, etc.  Please have this removed prior to surgery.  If it is not removed, there is a chance that hospital personnel will need to cut it off on the day of surgery.  Do not wear lotions, powders, or perfumes.   Do not shave body hair from the neck down 48 hours before surgery.  Contact lenses, hearing aids and dentures may not be worn into surgery.  Do not bring valuables to the hospital. [facility] is not responsible for any missing/lost belongings or valuables.   Notify your doctor if there is any change in your medical condition (cold, fever, infection).  Wear comfortable clothing (specific to your surgery type) to the hospital.  After surgery, you can help prevent lung complications by doing breathing exercises.  Take deep breaths and cough every 1-2 hours. Your doctor may order a device called an Incentive Spirometer to help you take deep breaths. When coughing or sneezing, hold a pillow firmly against your incision with both hands. This is called "splinting." Doing this helps protect your incision. It also decreases belly discomfort.  If you are being admitted to the  hospital overnight, leave your suitcase in the car. After surgery it may be brought to your room.  In case of increased patient census, it may be necessary for you, the patient, to continue your postoperative care in the Same Day Surgery department.  If you are being discharged the day of surgery, you will not be allowed to drive home. You will need a responsible individual to  drive you home and stay with you for 24 hours after surgery.   If you are taking public transportation, you will need to have a responsible individual with you.  Please call the Pre-admissions Testing Dept. at [phone] if you have any questions about these instructions.  Surgery Visitation Policy:  Patients having surgery or a procedure may have two visitors.  Children under the age of 59 must have an adult with them who is not the patient.  Temporary Visitor Restrictions Due to increasing cases of flu, RSV and COVID-19: Children ages 45 and under will not be able to visit patients in [facility] hospitals under most circumstances.     Preparing for Surgery with CHLORHEXIDINE GLUCONATE (CHG) Soap  Chlorhexidine Gluconate (CHG) Soap  o An antiseptic cleaner that kills germs and bonds with the skin to continue killing germs even after washing  o Used for showering the night before surgery and morning of surgery  Before surgery, you can play an important role by reducing the number of germs on your skin.  CHG (Chlorhexidine gluconate) soap is an antiseptic cleanser which kills germs and bonds with the skin to continue killing germs even after washing.  Please do not use if you have an allergy to CHG or antibacterial soaps. If your skin becomes reddened/irritated stop using the CHG.  1. Shower the NIGHT BEFORE SURGERY and the MORNING OF SURGERY with CHG soap.  2. If you choose to wash your hair, wash your hair first as usual with your normal shampoo.  3. After shampooing, rinse your hair and body thoroughly to remove the shampoo.  4. Use CHG as you would any other liquid soap. You can apply CHG directly to the skin and wash gently with a scrungie or a clean washcloth.  5. Apply the CHG soap to your body only from the neck down. Do not use on open wounds or open sores. Avoid contact with your eyes, ears, mouth, and genitals (private parts). Wash face and genitals (private  parts) with your normal soap.  6. Wash thoroughly, paying special attention to the area where your surgery will be performed.  7. Thoroughly rinse your body with warm water.  8. Do not shower/wash with your normal soap after using and rinsing off the CHG soap.  9. Pat yourself dry with a clean towel.  10. Wear clean pajamas to bed the night before surgery.  12. Place clean sheets on your bed the night of your first shower and do not sleep with pets.  13. Shower again with the CHG soap on the day of surgery prior to arriving at the hospital.  14. Do not apply any deodorants/lotions/powders.  15. Please wear clean clothes to the hospital.

## 2023-06-02 ENCOUNTER — Encounter
Admission: RE | Admit: 2023-06-02 | Discharge: 2023-06-02 | Disposition: A | Discharge status: Home / Self Care | Source: Ambulatory Visit | Attending: Obstetrics and Gynecology | Admitting: Obstetrics and Gynecology

## 2023-06-02 DIAGNOSIS — Z01812 Encounter for preprocedural laboratory examination: Secondary | ICD-10-CM

## 2023-06-02 DIAGNOSIS — I10 Essential (primary) hypertension: Secondary | ICD-10-CM | POA: Insufficient documentation

## 2023-06-02 DIAGNOSIS — E1165 Type 2 diabetes mellitus with hyperglycemia: Secondary | ICD-10-CM | POA: Insufficient documentation

## 2023-06-02 DIAGNOSIS — Z79899 Other long term (current) drug therapy: Secondary | ICD-10-CM | POA: Diagnosis not present

## 2023-06-02 DIAGNOSIS — Z01810 Encounter for preprocedural cardiovascular examination: Secondary | ICD-10-CM | POA: Diagnosis not present

## 2023-06-02 DIAGNOSIS — Z794 Long term (current) use of insulin: Secondary | ICD-10-CM | POA: Insufficient documentation

## 2023-06-02 DIAGNOSIS — Z01818 Encounter for other preprocedural examination: Secondary | ICD-10-CM | POA: Diagnosis present

## 2023-06-02 LAB — BASIC METABOLIC PANEL
Anion gap: 5 (ref 5–15)
BUN: 9 mg/dL (ref 6–20)
CO2: 28 mmol/L (ref 22–32)
Calcium: 9 mg/dL (ref 8.9–10.3)
Chloride: 102 mmol/L (ref 98–111)
Creatinine, Ser: 0.64 mg/dL (ref 0.44–1.00)
GFR, Estimated: >60 mL/min (ref >60)
Glucose, Bld: 214 mg/dL — ABNORMAL HIGH (ref 70–99)
Potassium: 4.7 mmol/L (ref 3.5–5.1)
Sodium: 135 mmol/L (ref 135–145)

## 2023-06-02 LAB — TYPE AND SCREEN
ABO/RH(D): A NEG
Antibody Screen: NEGATIVE

## 2023-06-03 NOTE — Progress Notes (Signed)
  Perioperative Services Pre-Admission/Anesthesia Testing    Date: 06/03/23  Name: Brianna Berger MRN:   [id number]  Re: GLP-1 clearance and provider recommendations   Planned Surgical Procedure(s):    Case: [id number] Date/Time: 06/07/23 0730   Procedure: HYSTERECTOMY, VAGINAL, LAPAROSCOPY-ASSISTED, WITH SALPINGO-OOPHORECTOMY (Bilateral)   Anesthesia type: Choice   Diagnosis: Pelvic pain [R10.2]   Pre-op diagnosis: Pelvic pain history of ovarian cysts, possible adenomyosis   Location: [facility] OR ROOM 05 / [facility]   Surgeons: Linzie Collin, MD      Clinical Notes:  Patient is scheduled for the above procedure with the indicated provider/surgeon. In review of her medication reconciliation it was noted that patient is on a prescribed GLP-1 medication. Per guidelines issued by the American Society of Anesthesiologists (ASA), it is recommended that these medications be held for 7 days prior to the patient undergoing any type of elective surgical procedure. The patient is taking the following GLP-1 medication:  [x]  SEMAGLUTIDE   []  EXENATIDE  []  LIRAGLUTIDE   []  LIXISENATIDE  []  DULAGLUTIDE     []  TIRZEPATIDE (GLP-1/GIP)  Reached out to prescribing provider Merlinda Frederick, PA-C) to make them aware of the guidelines from anesthesia. Given that this patient takes the prescribed GLP-1 medication for her  diabetes diagnosis, rather than for weight loss, recommendations from the prescribing provider were solicited. Prescribing provider made aware of the following so that informed decision/POC can be developed for this patient that may be taking medications belonging to these drug classes:  Oral GLP-1 medications will be held 1 day prior to surgery.  Injectable GLP-1 medications will be held 7 days prior to surgery.  Metformin is routinely held 48 hours prior to surgery due to renal concerns, potential need for contrasted imaging perioperatively, and the potential for  tissue hypoxia leading to drug induced lactic acidosis.  All SGLT2i medications are held 72 hours prior to surgery as they can be associated with the increased potential for developing euglycemic diabetic ketoacidosis (EDKA).   Impression and Plan:  Brianna Berger is on a prescribed GLP-1 medication, which induces the known side effect of decreased gastric emptying. Efforts are bring made to mitigate the risk of perioperative hyperglycemic events, as elevated blood glucose levels have been found to contribute to intra/postoperative complications. Additionally, hyperglycemic extremes can potentially necessitate the postponing of a patient's elective case in order to better optimize perioperative glycemic control, again with the aforementioned guidelines in place. With this in mind, recommendations have been sought from the prescribing provider, who has cleared patient to proceed with holding the prescribed GLP-1 as per the guidelines from the ASA.   Provider recommending: no further recommendations received from the prescribing provider.  Copy of signed clearance and recommendations placed on patient's chart for inclusion in their medical record and for review by the surgical/anesthetic team on the day of her procedure.   Quentin Mulling, MSN, APRN, FNP-C, CEN [facility]  Perioperative Services Nurse Practitioner Phone: [phone] Fax: [phone] 06/03/23 1:27 PM  NOTE: This note has been prepared using Dragon dictation software. Despite my best ability to proofread, there is always the potential that unintentional transcriptional errors may still occur from this process.

## 2023-06-07 ENCOUNTER — Other Ambulatory Visit: Payer: Self-pay

## 2023-06-07 ENCOUNTER — Encounter
Admission: RE | Disposition: A | Discharge status: Home / Self Care | Payer: Self-pay | Source: Home / Self Care | Attending: Obstetrics and Gynecology

## 2023-06-07 ENCOUNTER — Ambulatory Visit: Payer: Self-pay | Admitting: Urgent Care

## 2023-06-07 ENCOUNTER — Encounter: Payer: Self-pay | Admitting: Obstetrics and Gynecology

## 2023-06-07 ENCOUNTER — Ambulatory Visit
Admission: RE | Admit: 2023-06-07 | Discharge: 2023-06-07 | Disposition: A | Discharge status: Home / Self Care | Attending: Obstetrics and Gynecology | Admitting: Obstetrics and Gynecology

## 2023-06-07 DIAGNOSIS — N8302 Follicular cyst of left ovary: Secondary | ICD-10-CM | POA: Diagnosis not present

## 2023-06-07 DIAGNOSIS — E119 Type 2 diabetes mellitus without complications: Secondary | ICD-10-CM | POA: Diagnosis not present

## 2023-06-07 DIAGNOSIS — N888 Other specified noninflammatory disorders of cervix uteri: Secondary | ICD-10-CM | POA: Insufficient documentation

## 2023-06-07 DIAGNOSIS — R102 Pelvic and perineal pain: Secondary | ICD-10-CM | POA: Diagnosis present

## 2023-06-07 DIAGNOSIS — E1165 Type 2 diabetes mellitus with hyperglycemia: Secondary | ICD-10-CM

## 2023-06-07 DIAGNOSIS — N8003 Adenomyosis of the uterus: Secondary | ICD-10-CM | POA: Insufficient documentation

## 2023-06-07 DIAGNOSIS — F17210 Nicotine dependence, cigarettes, uncomplicated: Secondary | ICD-10-CM | POA: Insufficient documentation

## 2023-06-07 DIAGNOSIS — I10 Essential (primary) hypertension: Secondary | ICD-10-CM | POA: Insufficient documentation

## 2023-06-07 DIAGNOSIS — Z794 Long term (current) use of insulin: Secondary | ICD-10-CM | POA: Diagnosis not present

## 2023-06-07 DIAGNOSIS — Z7985 Long-term (current) use of injectable non-insulin antidiabetic drugs: Secondary | ICD-10-CM | POA: Diagnosis not present

## 2023-06-07 DIAGNOSIS — Z9851 Tubal ligation status: Secondary | ICD-10-CM | POA: Diagnosis not present

## 2023-06-07 DIAGNOSIS — N8301 Follicular cyst of right ovary: Secondary | ICD-10-CM | POA: Insufficient documentation

## 2023-06-07 DIAGNOSIS — N870 Mild cervical dysplasia: Secondary | ICD-10-CM | POA: Diagnosis not present

## 2023-06-07 DIAGNOSIS — Z975 Presence of (intrauterine) contraceptive device: Secondary | ICD-10-CM | POA: Insufficient documentation

## 2023-06-07 DIAGNOSIS — Z01818 Encounter for other preprocedural examination: Secondary | ICD-10-CM

## 2023-06-07 HISTORY — PX: IUD REMOVAL: SHX5392

## 2023-06-07 HISTORY — PX: LAPAROSCOPIC VAGINAL HYSTERECTOMY WITH SALPINGO OOPHORECTOMY: SHX6681

## 2023-06-07 LAB — GLUCOSE, CAPILLARY
Glucose-Capillary: 107 mg/dL — ABNORMAL HIGH (ref 70–99)
Glucose-Capillary: 187 mg/dL — ABNORMAL HIGH (ref 70–99)
Glucose-Capillary: 164 mg/dL — ABNORMAL HIGH (ref 70–99)

## 2023-06-07 LAB — POCT PREGNANCY, URINE: Preg Test, Ur: NEGATIVE

## 2023-06-07 LAB — ABO/RH: ABO/RH(D): A NEG

## 2023-06-07 SURGERY — HYSTERECTOMY, VAGINAL, LAPAROSCOPY-ASSISTED, WITH SALPINGO-OOPHORECTOMY
Anesthesia: General | Site: Vagina

## 2023-06-07 MED ORDER — PROPOFOL 10 MG/ML IV BOLUS
INTRAVENOUS | Status: AC
  Filled 2023-06-07: qty 20

## 2023-06-07 MED ORDER — CHLORHEXIDINE GLUCONATE 0.12 % MT SOLN
15.0000 mL | Freq: Once | OROMUCOSAL | Status: AC
  Administered 2023-06-07: 15 mL via OROMUCOSAL

## 2023-06-07 MED ORDER — MIDAZOLAM HCL 2 MG/2ML IJ SOLN
INTRAMUSCULAR | Status: DC | PRN
  Administered 2023-06-07: 2 mg via INTRAVENOUS

## 2023-06-07 MED ORDER — CEFAZOLIN SODIUM-DEXTROSE 2-4 GM/100ML-% IV SOLN
INTRAVENOUS | Status: AC
  Filled 2023-06-07: qty 100

## 2023-06-07 MED ORDER — DEXAMETHASONE SODIUM PHOSPHATE 10 MG/ML IJ SOLN
INTRAMUSCULAR | Status: DC | PRN
  Administered 2023-06-07: 5 mg via INTRAVENOUS

## 2023-06-07 MED ORDER — DROPERIDOL 2.5 MG/ML IJ SOLN
INTRAMUSCULAR | Status: AC
  Filled 2023-06-07: qty 2

## 2023-06-07 MED ORDER — PROPOFOL 10 MG/ML IV BOLUS
INTRAVENOUS | Status: DC | PRN
  Administered 2023-06-07: 150 mg via INTRAVENOUS

## 2023-06-07 MED ORDER — SODIUM CHLORIDE 0.9 % IV SOLN: INTRAVENOUS | Status: DC

## 2023-06-07 MED ORDER — OXYCODONE HCL 5 MG PO TABS
5.0000 mg | ORAL_TABLET | Freq: Once | ORAL | Status: AC | PRN
  Administered 2023-06-07: 5 mg via ORAL

## 2023-06-07 MED ORDER — LIDOCAINE HCL (PF) 2 % IJ SOLN
INTRAMUSCULAR | Status: AC
  Filled 2023-06-07: qty 5

## 2023-06-07 MED ORDER — ACETAMINOPHEN 10 MG/ML IV SOLN
INTRAVENOUS | Status: DC | PRN
  Administered 2023-06-07: 1000 mg via INTRAVENOUS

## 2023-06-07 MED ORDER — SUGAMMADEX SODIUM 200 MG/2ML IV SOLN
INTRAVENOUS | Status: DC | PRN
Start: 2023-06-07 — End: 2023-06-07
  Administered 2023-06-07: 160.6 mg via INTRAVENOUS

## 2023-06-07 MED ORDER — FENTANYL CITRATE (PF) 100 MCG/2ML IJ SOLN
INTRAMUSCULAR | Status: AC
  Filled 2023-06-07: qty 2

## 2023-06-07 MED ORDER — ACETAMINOPHEN 500 MG PO TABS: 1000.0000 mg | ORAL_TABLET | ORAL | Status: DC

## 2023-06-07 MED ORDER — ORAL CARE MOUTH RINSE: 15.0000 mL | Freq: Once | OROMUCOSAL | Status: AC

## 2023-06-07 MED ORDER — LIDOCAINE HCL (CARDIAC) PF 100 MG/5ML IV SOSY
PREFILLED_SYRINGE | INTRAVENOUS | Status: DC | PRN
  Administered 2023-06-07: 100 mg via INTRAVENOUS

## 2023-06-07 MED ORDER — ROCURONIUM BROMIDE 10 MG/ML (PF) SYRINGE
PREFILLED_SYRINGE | INTRAVENOUS | Status: AC
  Filled 2023-06-07: qty 10

## 2023-06-07 MED ORDER — ACETAMINOPHEN 500 MG PO TABS
ORAL_TABLET | ORAL | Status: AC
  Filled 2023-06-07: qty 1

## 2023-06-07 MED ORDER — ONDANSETRON HCL 4 MG/2ML IJ SOLN
INTRAMUSCULAR | Status: AC
  Filled 2023-06-07: qty 2

## 2023-06-07 MED ORDER — SEVOFLURANE IN SOLN
RESPIRATORY_TRACT | Status: AC
  Filled 2023-06-07: qty 250

## 2023-06-07 MED ORDER — FENTANYL CITRATE (PF) 100 MCG/2ML IJ SOLN
25.0000 ug | INTRAMUSCULAR | Status: DC | PRN
  Administered 2023-06-07 (×6): 25 ug via INTRAVENOUS

## 2023-06-07 MED ORDER — ACETAMINOPHEN 10 MG/ML IV SOLN
INTRAVENOUS | Status: AC
  Filled 2023-06-07: qty 100

## 2023-06-07 MED ORDER — OXYCODONE HCL 5 MG PO TABS
ORAL_TABLET | ORAL | Status: AC
  Filled 2023-06-07: qty 1

## 2023-06-07 MED ORDER — CEFAZOLIN SODIUM-DEXTROSE 2-4 GM/100ML-% IV SOLN
2.0000 g | INTRAVENOUS | Status: AC
  Administered 2023-06-07: 2 g via INTRAVENOUS

## 2023-06-07 MED ORDER — HYDROCODONE-ACETAMINOPHEN 5-325 MG PO TABS
1.0000 | ORAL_TABLET | Freq: Four times a day (QID) | ORAL | 0 refills | Status: DC | PRN
Start: 2023-06-07 — End: 2023-06-15

## 2023-06-07 MED ORDER — BUPIVACAINE HCL (PF) 0.5 % IJ SOLN
INTRAMUSCULAR | Status: AC
  Filled 2023-06-07: qty 30

## 2023-06-07 MED ORDER — 0.9 % SODIUM CHLORIDE (POUR BTL) OPTIME
TOPICAL | Status: DC | PRN
  Administered 2023-06-07: 1000 mL

## 2023-06-07 MED ORDER — POVIDONE-IODINE 10 % EX SWAB
2.0000 | Freq: Once | CUTANEOUS | Status: AC
  Administered 2023-06-07: 2 via TOPICAL

## 2023-06-07 MED ORDER — DEXAMETHASONE SODIUM PHOSPHATE 10 MG/ML IJ SOLN
INTRAMUSCULAR | Status: AC
  Filled 2023-06-07: qty 1

## 2023-06-07 MED ORDER — DEXMEDETOMIDINE HCL IN NACL 80 MCG/20ML IV SOLN
INTRAVENOUS | Status: AC
  Filled 2023-06-07: qty 20

## 2023-06-07 MED ORDER — BUPIVACAINE HCL 0.5 % IJ SOLN
INTRAMUSCULAR | Status: DC | PRN
  Administered 2023-06-07: 15 mL

## 2023-06-07 MED ORDER — DEXMEDETOMIDINE HCL IN NACL 80 MCG/20ML IV SOLN
INTRAVENOUS | Status: DC | PRN
  Administered 2023-06-07: 8 ug via INTRAVENOUS

## 2023-06-07 MED ORDER — CHLORHEXIDINE GLUCONATE 0.12 % MT SOLN
OROMUCOSAL | Status: AC
  Filled 2023-06-07: qty 15

## 2023-06-07 MED ORDER — GABAPENTIN 300 MG PO CAPS
ORAL_CAPSULE | ORAL | Status: AC
  Filled 2023-06-07: qty 1

## 2023-06-07 MED ORDER — DROPERIDOL 2.5 MG/ML IJ SOLN
0.6250 mg | Freq: Once | INTRAMUSCULAR | Status: AC | PRN
  Administered 2023-06-07: 0.625 mg via INTRAVENOUS

## 2023-06-07 MED ORDER — CELECOXIB 200 MG PO CAPS
400.0000 mg | ORAL_CAPSULE | ORAL | Status: AC
  Administered 2023-06-07: 400 mg via ORAL

## 2023-06-07 MED ORDER — CELECOXIB 200 MG PO CAPS
ORAL_CAPSULE | ORAL | Status: AC
  Filled 2023-06-07: qty 2

## 2023-06-07 MED ORDER — OXYCODONE HCL 5 MG/5ML PO SOLN: 5.0000 mg | Freq: Once | ORAL | Status: AC | PRN

## 2023-06-07 MED ORDER — FENTANYL CITRATE (PF) 100 MCG/2ML IJ SOLN
INTRAMUSCULAR | Status: DC | PRN
Start: 2023-06-07 — End: 2023-06-07
  Administered 2023-06-07 (×3): 50 ug via INTRAVENOUS

## 2023-06-07 MED ORDER — VASOPRESSIN 20 UNIT/ML IV SOLN
INTRAVENOUS | Status: AC
  Filled 2023-06-07: qty 1

## 2023-06-07 MED ORDER — MIDAZOLAM HCL 2 MG/2ML IJ SOLN
INTRAMUSCULAR | Status: AC
  Filled 2023-06-07: qty 2

## 2023-06-07 MED ORDER — ONDANSETRON HCL 4 MG/2ML IJ SOLN
INTRAMUSCULAR | Status: DC | PRN
  Administered 2023-06-07: 4 mg via INTRAVENOUS

## 2023-06-07 MED ORDER — SODIUM CHLORIDE (PF) 0.9 % IJ SOLN
INTRAMUSCULAR | Status: AC
  Filled 2023-06-07: qty 50

## 2023-06-07 MED ORDER — VASOPRESSIN 20 UNIT/ML IV SOLN
INTRAVENOUS | Status: DC | PRN
  Administered 2023-06-07: 14 mL via INTRAMUSCULAR

## 2023-06-07 MED ORDER — ROCURONIUM BROMIDE 100 MG/10ML IV SOLN
INTRAVENOUS | Status: DC | PRN
  Administered 2023-06-07: 50 mg via INTRAVENOUS
  Administered 2023-06-07: 10 mg via INTRAVENOUS

## 2023-06-07 MED ORDER — GABAPENTIN 300 MG PO CAPS
300.0000 mg | ORAL_CAPSULE | ORAL | Status: AC
  Administered 2023-06-07: 300 mg via ORAL

## 2023-06-07 MED ORDER — LACTATED RINGERS IV SOLN: INTRAVENOUS | Status: DC

## 2023-06-07 MED ORDER — ACETAMINOPHEN 10 MG/ML IV SOLN: 1000.0000 mg | Freq: Once | INTRAVENOUS | Status: DC | PRN

## 2023-06-07 MED ORDER — PHENYLEPHRINE 80 MCG/ML (10ML) SYRINGE FOR IV PUSH (FOR BLOOD PRESSURE SUPPORT)
PREFILLED_SYRINGE | INTRAVENOUS | Status: DC | PRN
  Administered 2023-06-07 (×2): 80 ug via INTRAVENOUS

## 2023-06-07 SURGICAL SUPPLY — 45 items
ADHESIVE MASTISOL STRL (MISCELLANEOUS) IMPLANT
BAG URINE DRAIN 2000ML AR STRL (UROLOGICAL SUPPLIES) ×2 IMPLANT
BLADE SURG SZ11 CARB STEEL (BLADE) ×2 IMPLANT
CATH URTH 16FR FL 2W BLN LF (CATHETERS) ×2 IMPLANT
CHLORAPREP W/TINT 26 (MISCELLANEOUS) ×2 IMPLANT
DERMABOND ADVANCED .7 DNX12 (GAUZE/BANDAGES/DRESSINGS) ×2 IMPLANT
ELECT REM PT RETURN 9FT ADLT (ELECTROSURGICAL) ×2 IMPLANT
ELECTRODE REM PT RTRN 9FT ADLT (ELECTROSURGICAL) ×2 IMPLANT
GAUZE 4X4 16PLY ~~LOC~~+RFID DBL (SPONGE) ×2 IMPLANT
GLOVE PI ORTHO PRO STRL 7.5 (GLOVE) ×4 IMPLANT
GOWN STRL REUS W/ TWL LRG LVL3 (GOWN DISPOSABLE) ×2 IMPLANT
GOWN STRL REUS W/ TWL XL LVL3 (GOWN DISPOSABLE) ×4 IMPLANT
HANDLE YANKAUER SUCT BULB TIP (MISCELLANEOUS) IMPLANT
IRRIGATION STRYKERFLOW (MISCELLANEOUS) IMPLANT
IRRIGATOR STRYKERFLOW (MISCELLANEOUS) IMPLANT
IV LACTATED RINGERS 1000ML (IV SOLUTION) ×2 IMPLANT
KIT PINK PAD W/HEAD ARE REST (MISCELLANEOUS) ×2 IMPLANT
KIT PINK PAD W/HEAD ARM REST (MISCELLANEOUS) ×2 IMPLANT
KIT TURNOVER CYSTO (KITS) ×2 IMPLANT
LIGASURE LAP MARYLAND 5MM 37CM (ELECTROSURGICAL) IMPLANT
MANIFOLD NEPTUNE II (INSTRUMENTS) ×2 IMPLANT
NDL SPNL 22GX3.5 QUINCKE BK (NEEDLE) ×2 IMPLANT
NEEDLE SPNL 22GX3.5 QUINCKE BK (NEEDLE) ×2 IMPLANT
PACK BASIN MINOR ARMC (MISCELLANEOUS) ×2 IMPLANT
PACK GYN LAPAROSCOPIC (MISCELLANEOUS) ×2 IMPLANT
PAD OB MATERNITY 11 LF (PERSONAL CARE ITEMS) ×2 IMPLANT
PAD PREP OB/GYN DISP 24X41 (PERSONAL CARE ITEMS) ×2 IMPLANT
PENCIL SMOKE EVACUATOR (MISCELLANEOUS) IMPLANT
RETRACTOR PHONTONGUIDE ADAPT (ADAPTER) IMPLANT
SCRUB CHG 4% DYNA-HEX 4OZ (MISCELLANEOUS) ×2 IMPLANT
SLEEVE Z-THREAD 5X100MM (TROCAR) ×4 IMPLANT
SOL ELECTROSURG ANTI STICK (MISCELLANEOUS) IMPLANT
SOLUTION ELECTROSURG ANTI STCK (MISCELLANEOUS) IMPLANT
SPONGE T-LAP 18X18 ~~LOC~~+RFID (SPONGE) IMPLANT
STRIP CLOSURE SKIN 1/2X4 (GAUZE/BANDAGES/DRESSINGS) IMPLANT
SUT VIC AB 0 CT1 27XCR 8 STRN (SUTURE) ×4 IMPLANT
SUT VIC AB 0 CT1 36 (SUTURE) ×2 IMPLANT
SUT VIC AB 4-0 FS2 27 (SUTURE) ×2 IMPLANT
SYR 10ML LL (SYRINGE) ×2 IMPLANT
SYR CONTROL 10ML LL (SYRINGE) ×2 IMPLANT
TAPE TRANSPORE STRL 2 31045 (GAUZE/BANDAGES/DRESSINGS) ×2 IMPLANT
TRAP FLUID SMOKE EVACUATOR (MISCELLANEOUS) ×2 IMPLANT
TROCAR Z-THREAD FIOS 5X100MM (TROCAR) ×2 IMPLANT
TUBING EVAC SMOKE HEATED PNEUM (TUBING) ×2 IMPLANT
WATER STERILE IRR 500ML POUR (IV SOLUTION) ×2 IMPLANT

## 2023-06-07 NOTE — Anesthesia Procedure Notes (Signed)
 Procedure Name: Intubation Date/Time: 06/07/2023 7:54 AM  Performed by: Stormy Fabian, CRNAPre-anesthesia Checklist: Patient identified, Patient being monitored, Timeout performed, Emergency Drugs available and Suction available Patient Re-evaluated:Patient Re-evaluated prior to induction Oxygen Delivery Method: Circle system utilized Preoxygenation: Pre-oxygenation with 100% oxygen Induction Type: IV induction Ventilation: Mask ventilation without difficulty Laryngoscope Size: Mac and 3 Grade View: Grade I Tube type: Oral Tube size: 7.0 mm Number of attempts: 1 Airway Equipment and Method: Stylet Placement Confirmation: ETT inserted through vocal cords under direct vision, positive ETCO2 and breath sounds checked- equal and bilateral Secured at: 21 cm Tube secured with: Tape Dental Injury: Teeth and Oropharynx as per pre-operative assessment

## 2023-06-07 NOTE — OR Nursing (Signed)
 IUD removed by Dr. Logan Bores in [facility] OR #5. Device intact.

## 2023-06-07 NOTE — Interval H&P Note (Signed)
 History and Physical Interval Note:  06/07/2023 7:42 AM  Brianna Berger  has presented today for surgery, with the diagnosis of Pelvic pain history of ovarian cysts, possible adenomyosis.  The various methods of treatment have been discussed with the patient and family. After consideration of risks, benefits and other options for treatment, the patient has consented to  Procedure(s): HYSTERECTOMY, VAGINAL, LAPAROSCOPY-ASSISTED, WITH SALPINGO-OOPHORECTOMY (Bilateral) as a surgical intervention.  The patient's history has been reviewed, patient examined, no change in status, stable for surgery.  I have reviewed the patient's chart and labs.  Questions were answered to the patient's satisfaction.     [name]

## 2023-06-07 NOTE — Transfer of Care (Signed)
 Immediate Anesthesia Transfer of Care Note  Patient: [name]  Procedure(s) Performed: Procedure(s): HYSTERECTOMY, VAGINAL, LAPAROSCOPY-ASSISTED, WITH SALPINGO-OOPHORECTOMY (Bilateral) REMOVAL, INTRAUTERINE DEVICE (N/A)  Patient Location: PACU  Anesthesia Type:General  Level of Consciousness: sedated  Airway & Oxygen Therapy: Patient Spontanous Breathing and Patient connected to face mask oxygen  Post-op Assessment: Report given to RN and Post -op Vital signs reviewed and stable  Post vital signs: Reviewed and stable  Last Vitals:  Vitals:   06/07/23 0625 06/07/23 0947  BP: 102/70 (!) 116/56  Pulse: (!) 110 (!) 106  Resp: 18 15  Temp: 37 C 37.4 C  SpO2: 100% 100%    Complications: No apparent anesthesia complications

## 2023-06-07 NOTE — Progress Notes (Signed)
 The patient has repeated attempted to urinate but was unable to produce more than 1-3 mls. A bladder scanner was performed and revealed 81 ml. The patient has completed 500 ml of normal saline upon arrival from the PACU. Attempts being made to reach Dr. Logan Bores regarding the situation. Awaiting a response.

## 2023-06-07 NOTE — Anesthesia Preprocedure Evaluation (Signed)
 Anesthesia Evaluation  Patient identified by MRN, date of birth, ID band Patient awake    Reviewed: Allergy & Precautions, H&P , NPO status , Patient's Chart, lab work & pertinent test results, reviewed documented beta blocker date and time   Airway Mallampati: II  TM Distance: >3 FB Neck ROM: full    Dental  (+) Teeth Intact   Pulmonary asthma , Current SmokerPatient did not abstain from smoking.   Pulmonary exam normal        Cardiovascular Exercise Tolerance: Good hypertension, On Medications negative cardio ROS Normal cardiovascular exam Rhythm:regular Rate:Normal     Neuro/Psych negative neurological ROS  negative psych ROS   GI/Hepatic negative GI ROS, Neg liver ROS,,,  Endo/Other  negative endocrine ROSdiabetes, Well Controlled    Renal/GU negative Renal ROS  negative genitourinary   Musculoskeletal   Abdominal   Peds  Hematology  (+) Blood dyscrasia, anemia   Anesthesia Other Findings Past Medical History: No date: Anemia No date: Asthma No date: DM (diabetes mellitus), type 2 (HCC) No date: Family history of adverse reaction to anesthesia     Comment:  maternal uncle-severe n/v No date: History of ovarian cyst No date: Hives     Comment:  occasional; unknown cause No date: Hypertension No date: Pelvic pain Past Surgical History: No date: CARPAL TUNNEL RELEASE; Left 10/20/2010: CHOLECYSTECTOMY     Comment:  Procedure: LAPAROSCOPIC CHOLECYSTECTOMY;  Surgeon:               Marlane Hatcher;  Location: [facility];  Service: General;              Laterality: N/A; 03/16/2008: TUBAL LIGATION     Comment:  [facility] BMI    Body Mass Index: 31.35 kg/m     Reproductive/Obstetrics negative OB ROS                             Anesthesia Physical Anesthesia Plan  ASA: 2  Anesthesia Plan: General ETT   Post-op Pain Management:    Induction:   PONV Risk Score and  Plan: 3 and TIVA  Airway Management Planned:   Additional Equipment:   Intra-op Plan:   Post-operative Plan:   Informed Consent: I have reviewed the patients History and Physical, chart, labs and discussed the procedure including the risks, benefits and alternatives for the proposed anesthesia with the patient or authorized representative who has indicated his/her understanding and acceptance.     Dental Advisory Given  Plan Discussed with: CRNA  Anesthesia Plan Comments:        Anesthesia Quick Evaluation

## 2023-06-07 NOTE — Op Note (Signed)
 OPERATIVE NOTE 06/07/2023 9:35 AM  PRE-OPERATIVE DIAGNOSIS:  1) Pelvic pain history of ovarian cysts, possible adenomyosis  POST-OPERATIVE DIAGNOSIS:  Post-Op Diagnosis Codes:    * Pelvic pain [R10.2]  OPERATION: Procedure(s) (LRB): HYSTERECTOMY, VAGINAL, LAPAROSCOPY-ASSISTED, WITH SALPINGO-OOPHORECTOMY (Bilateral) REMOVAL, INTRAUTERINE DEVICE (N/A)    SURGEON(S): Surgeons and Role:    Linzie Collin, MD - Primary    * Hildred Laser, MD - Assisting   ANESTHESIA: General  ESTIMATED BLOOD LOSS: 75mL  SPECIMEN:  ID Type Source Tests Collected by Time Destination  1 : Cervix, uterus, bilateral fallopian tubes and ovaries Tissue PATH Gyn benign resection SURGICAL PATHOLOGY Linzie Collin, MD 06/07/2023 [phone]     COMPLICATIONS: None  DRAINS: None  DISPOSITION: Stable to recovery room  DESCRIPTION OF PROCEDURE:      The patient was prepped and draped in the dorsolithotomy position and placed under general anesthesia. The bladder was emptied. The cervix was grasped with a multi-toothed tenaculum and a uterine manipulator was placed within the cervical os respecting the position and curvature of the uterus. After changing gloves we proceeded abdominally. A small infraumbilical incision was made and a 5 mm trocar port was placed within the abdominopelvic cavity. The opening pressure was less than 7 mmHg.  Approximately 3 and 1/2 L of carbon dioxide gas was instilled within the abdominal pelvic cavity. The laparoscope was placed and the pelvis and abdomen were carefully inspected. In the usual manner, under direct visualization right and left lower quadrant ports of 5 mm size were placed. Both ureters were identified in the pelvis prior to dissection or clamping and cutting of pedicles. A window was made in the broad ligament and the infundibulopelvic ligaments were carefully identified. The ligaments were clamped divided and doubly suture ligated. Hemostasis of the pedicles  was noted. The fallopian tubes were elevated and the mesenteric side systematically coagulated and divided allowing the tube to be removed at the time of uterine removal. The round ligaments were coagulated and divided and a bladder flap was created. The upper aspect of the broad ligament was clamped coagulated and divided. The uterine arteries were skeletonized, triply coagulated and divided. Careful inspection of all pedicles and the remainder of the pelvis was performed. Hemostasis was noted. The lower quadrant ports were removed, hemostasis of the port sites was noted, and the incisions were closed in subcuticular manner. The laparoscope and trocar sleeve were removed from the infraumbilical incision, hemostasis was noted, and the incision was closed in a subcuticular manner. A long-acting anesthetic was employed in the skin incisions. We then proceeded vaginally. A weighted speculum was placed posteriorly. A multi-toothed tenaculum was used to grasp the cervix and the cervix was injected in a circumferential manner with a dilute Pitressin solution. An incision was made around the cervix and the vaginal mucosa was dissected off of the cervix. The posterior cul-de-sac was identified and entered and the weighted speculum was placed within this. The anterior cul-de-sac was identified and entered and a retractor was placed and used to retract the bladder anteriorly keeping it out of the operative field. The uterosacral ligaments were clamped divided and suture ligated. The cardinal ligaments were clamped divided and suture ligated. The small remaining pedicle was clamped divided and suture ligated bilaterally allowing delivery of the specimen. Angle sutures were placed in the usual manner. A culdoplasty was performed. The peritoneum was identified anteriorly and then incorporating the left upper pedicle left lower pedicle right lower pedicle right upper  pedicle and anterior peritoneum a pursestring suture was  placed exteriorizing all pedicles. Hemostasis of all pedicles was noted at this time. The vaginal mucosa was then closed with a running suture of Vicryl. The patient went to recovery room in stable condition. Clear urine was noted in the Foley at the conclusion of the procedure. Dr. Valentino Saxon provided exposure, dissection, suctioning, retraction, and general support and assistance during the procedure.   Elonda Husky, M.D. 06/07/2023 9:35 AM

## 2023-06-07 NOTE — Progress Notes (Signed)
 Dr. Logan Bores has requested the patient remain in the hospital until she is able to urinate substantially. He has ordered fluids and continued monitoring. The patient was informed and verbalized understanding.

## 2023-06-08 ENCOUNTER — Encounter: Payer: Self-pay | Admitting: Obstetrics and Gynecology

## 2023-06-08 NOTE — Anesthesia Postprocedure Evaluation (Signed)
 Anesthesia Post Note  Patient: Angelica Chessman  Procedure(s) Performed: HYSTERECTOMY, VAGINAL, LAPAROSCOPY-ASSISTED, WITH SALPINGO-OOPHORECTOMY (Bilateral: Vagina ) REMOVAL, INTRAUTERINE DEVICE (Uterus)  Patient location during evaluation: PACU Anesthesia Type: General Level of consciousness: awake and alert Pain management: pain level controlled Vital Signs Assessment: post-procedure vital signs reviewed and stable Respiratory status: spontaneous breathing, nonlabored ventilation, respiratory function stable and patient connected to nasal cannula oxygen Cardiovascular status: blood pressure returned to baseline and stable Postop Assessment: no apparent nausea or vomiting Anesthetic complications: no   No notable events documented.   Last Vitals:  Vitals:   06/07/23 1119 06/07/23 1528  BP: 118/68 (!) 132/92  Pulse: 85   Resp: 15   Temp: 37.1 C   SpO2: 98% 98%    Last Pain:  Vitals:   06/08/23 0903  TempSrc:   PainSc: 2                  Yevette Edwards

## 2023-06-09 LAB — SURGICAL PATHOLOGY

## 2023-06-15 ENCOUNTER — Encounter: Payer: Self-pay | Admitting: Obstetrics and Gynecology

## 2023-06-15 ENCOUNTER — Ambulatory Visit (INDEPENDENT_AMBULATORY_CARE_PROVIDER_SITE_OTHER): Admitting: Obstetrics and Gynecology

## 2023-06-15 VITALS — BP 114/83 | HR 116 | Ht 63.0 in | Wt 176.0 lb

## 2023-06-15 DIAGNOSIS — Z9889 Other specified postprocedural states: Secondary | ICD-10-CM

## 2023-06-15 DIAGNOSIS — Z780 Asymptomatic menopausal state: Secondary | ICD-10-CM

## 2023-06-15 DIAGNOSIS — Z4889 Encounter for other specified surgical aftercare: Secondary | ICD-10-CM

## 2023-06-15 MED ORDER — ESTRADIOL 2 MG PO TABS
2.0000 mg | ORAL_TABLET | Freq: Every day | ORAL | 3 refills | Status: AC
Start: 2023-06-15 — End: 2024-06-14

## 2023-06-15 NOTE — Progress Notes (Signed)
 HPI:      [name] is a 43 y.o. G4P3 who LMP was Patient's last menstrual period was 05/19/2023 (approximate).  Subjective:   She presents today 1 week from LAVH BSO.  She was found to have adenomyosis.  She reports that she is doing well ambulating voiding eating without difficulty.  She does not have any significant pain.  She has noticed some vaginal bleeding but very light. She has been having some worsening hot flashes.    Hx: The following portions of the patient's history were reviewed and updated as appropriate:             She  has a past medical history of Anemia, Asthma, DM (diabetes mellitus), type 2 (HCC), Family history of adverse reaction to anesthesia, History of ovarian cyst, Hives, Hypertension, and Pelvic pain. She does not have any pertinent problems on file. She  has a past surgical history that includes Tubal ligation (03/16/2008); Cholecystectomy (10/20/2010); Carpal tunnel release (Left); Laparoscopic vaginal hysterectomy with salpingo oophorectomy (Bilateral, 06/07/2023); and IUD removal (N/A, 06/07/2023). Her family history includes Hypertension in her mother. She  reports that she has been smoking cigarettes. She has never used smokeless tobacco. She reports that she does not drink alcohol and does not use drugs. She has a current medication list which includes the following prescription(s): acetaminophen, albuterol, atorvastatin, one touch ultra 2, estradiol, ibuprofen, lantus solostar, levonorgestrel, multivitamin with minerals, nystatin, ozempic (1 mg/dose), and valsartan-hydrochlorothiazide. She has no known allergies.       Review of Systems:  Review of Systems  Constitutional: Denied constitutional symptoms, night sweats, recent illness, fatigue, fever, insomnia and weight loss.  Eyes: Denied eye symptoms, eye pain, photophobia, vision change and visual disturbance.  Ears/Nose/Throat/Neck: Denied ear, nose, throat or neck symptoms, hearing loss, nasal  discharge, sinus congestion and sore throat.  Cardiovascular: Denied cardiovascular symptoms, arrhythmia, chest pain/pressure, edema, exercise intolerance, orthopnea and palpitations.  Respiratory: Denied pulmonary symptoms, asthma, pleuritic pain, productive sputum, cough, dyspnea and wheezing.  Gastrointestinal: Denied, gastro-esophageal reflux, melena, nausea and vomiting.  Genitourinary:  Denied genitourinary symptoms including symptomatic vaginal discharge, pelvic relaxation issues, and urinary complaints.  Musculoskeletal: Denied musculoskeletal symptoms, stiffness, swelling, muscle weakness and myalgia.  Dermatologic: Denied dermatology symptoms, rash and scar.  Neurologic: Denied neurology symptoms, dizziness, headache, neck pain and syncope.  Psychiatric: Denied psychiatric symptoms, anxiety and depression.  Endocrine: Denied endocrine symptoms including hot flashes and night sweats.   Meds:   Current Outpatient Medications on File Prior to Visit  Medication Sig Dispense Refill   acetaminophen (TYLENOL) 500 MG tablet Take 1,000 mg by mouth every 6 (six) hours as needed for moderate pain (pain score 4-6).     albuterol (VENTOLIN HFA) 108 (90 Base) MCG/ACT inhaler Inhale 1-2 puffs into the lungs every 6 (six) hours as needed for wheezing or shortness of breath.     atorvastatin (LIPITOR) 20 MG tablet Take 20 mg by mouth at bedtime.     Blood Glucose Monitoring Suppl (ONE TOUCH ULTRA 2) w/Device KIT Use to check blood sugar  0   ibuprofen (ADVIL) 200 MG tablet Take 400 mg by mouth every 6 (six) hours as needed for moderate pain (pain score 4-6).     insulin glargine (LANTUS SOLOSTAR) 100 UNIT/ML Solostar Pen Inject 32 Units into the skin at bedtime.     Levonorgestrel (LILETTA, 52 MG, IU) 1 Intra Uterine Device by Intrauterine route once.     Multiple Vitamin (MULTIVITAMIN WITH MINERALS) TABS tablet  Take 1 tablet by mouth daily.     nystatin (MYCOSTATIN/NYSTOP) powder Apply 1  Application topically daily as needed (irritation).     OZEMPIC, 1 MG/DOSE, 4 MG/3ML SOPN Inject 1 mg into the skin every Saturday.     valsartan-hydrochlorothiazide (DIOVAN-HCT) 320-25 MG tablet Take 1 tablet by mouth every evening.     No current facility-administered medications on file prior to visit.      Objective:     Vitals:   06/15/23 1438  BP: 114/83  Pulse: (!) 116   Filed Weights   06/15/23 1438  Weight: 176 lb (79.8 kg)               Abdomen: Soft.  Non-tender.  No masses.  No HSM.  Incision/s: Intact.  Healing well.  No erythema.  No drainage.             Assessment:    G4P3 Patient Active Problem List   Diagnosis Date Noted   Type 2 diabetes mellitus with hyperglycemia, with long-term current use of insulin (HCC) 12/16/2018     1. Postoperative state   2. 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  Wound care discussed  2.  Begin 2 mg of estrogen daily  3.  May slowly resume normal activities with exception of heavy lifting and nothing in the vagina. Orders No orders of the defined types were placed in this encounter.    Meds ordered this encounter  Medications   estradiol (ESTRACE) 2 MG tablet    Sig: Take 1 tablet (2 mg total) by mouth daily.    Dispense:  90 tablet    Refill:  3      F/U  Return in about 5 weeks (around 07/20/2023).  Elonda Husky, M.D. 06/15/2023 2:53 PM

## 2023-06-15 NOTE — Progress Notes (Signed)
 Patient presents for 1 week postop follow-up following LAVH/BSO. She states doing well, does have complaints of hot flashes and night sweats.

## 2023-06-17 ENCOUNTER — Encounter: Payer: Self-pay | Admitting: Obstetrics and Gynecology

## 2023-06-17 NOTE — Telephone Encounter (Signed)
 Can Dr Logan Bores treat her for yeast on her tongue?  She thinks its from taking the medication from surgery. Or should she see PCP.

## 2023-07-20 ENCOUNTER — Encounter: Payer: Self-pay | Admitting: Obstetrics and Gynecology

## 2023-07-20 ENCOUNTER — Ambulatory Visit (INDEPENDENT_AMBULATORY_CARE_PROVIDER_SITE_OTHER): Admitting: Obstetrics and Gynecology

## 2023-07-20 VITALS — Ht 63.0 in | Wt 179.3 lb

## 2023-07-20 DIAGNOSIS — Z780 Asymptomatic menopausal state: Secondary | ICD-10-CM

## 2023-07-20 DIAGNOSIS — Z9889 Other specified postprocedural states: Secondary | ICD-10-CM

## 2023-07-20 NOTE — Progress Notes (Signed)
 HPI:      [name] is a 43 y.o. G4P3 who LMP was Patient's last menstrual period was 05/19/2023 (approximate).  Subjective:   She presents today 6 weeks from LAVH BSO.  She was subsequently found to have adenomyosis.  She is taking 2 mg of Estrace  daily.  She denies hot flashes.  She is not having any pelvic or abdominal pain.  She is having normal bowel movements and urinating without issue.  She would like to return to work.    Hx: The following portions of the patient's history were reviewed and updated as appropriate:             She  has a past medical history of Anemia, Asthma, DM (diabetes mellitus), type 2 (HCC), Family history of adverse reaction to anesthesia, History of ovarian cyst, Hives, Hypertension, and Pelvic pain. She does not have any pertinent problems on file. She  has a past surgical history that includes Tubal ligation (03/16/2008); Cholecystectomy (10/20/2010); Carpal tunnel release (Left); Laparoscopic vaginal hysterectomy with salpingo oophorectomy (Bilateral, 06/07/2023); and IUD removal (N/A, 06/07/2023). Her family history includes Hypertension in her mother. She  reports that she has been smoking cigarettes. She has never used smokeless tobacco. She reports that she does not drink alcohol and does not use drugs. She has a current medication list which includes the following prescription(s): acetaminophen , albuterol, atorvastatin, one touch ultra 2, estradiol , ibuprofen, lantus solostar, levonorgestrel, multivitamin with minerals, nystatin, ozempic (1 mg/dose), and valsartan-hydrochlorothiazide. She has no known allergies.       Review of Systems:  Review of Systems  Constitutional: Denied constitutional symptoms, night sweats, recent illness, fatigue, fever, insomnia and weight loss.  Eyes: Denied eye symptoms, eye pain, photophobia, vision change and visual disturbance.  Ears/Nose/Throat/Neck: Denied ear, nose, throat or neck symptoms, hearing loss, nasal  discharge, sinus congestion and sore throat.  Cardiovascular: Denied cardiovascular symptoms, arrhythmia, chest pain/pressure, edema, exercise intolerance, orthopnea and palpitations.  Respiratory: Denied pulmonary symptoms, asthma, pleuritic pain, productive sputum, cough, dyspnea and wheezing.  Gastrointestinal: Denied, gastro-esophageal reflux, melena, nausea and vomiting.  Genitourinary: Denied genitourinary symptoms including symptomatic vaginal discharge, pelvic relaxation issues, and urinary complaints.  Musculoskeletal: Denied musculoskeletal symptoms, stiffness, swelling, muscle weakness and myalgia.  Dermatologic: Denied dermatology symptoms, rash and scar.  Neurologic: Denied neurology symptoms, dizziness, headache, neck pain and syncope.  Psychiatric: Denied psychiatric symptoms, anxiety and depression.  Endocrine: Denied endocrine symptoms including hot flashes and night sweats.   Meds:   Current Outpatient Medications on File Prior to Visit  Medication Sig Dispense Refill   acetaminophen  (TYLENOL ) 500 MG tablet Take 1,000 mg by mouth every 6 (six) hours as needed for moderate pain (pain score 4-6).     albuterol (VENTOLIN HFA) 108 (90 Base) MCG/ACT inhaler Inhale 1-2 puffs into the lungs every 6 (six) hours as needed for wheezing or shortness of breath.     atorvastatin (LIPITOR) 20 MG tablet Take 20 mg by mouth at bedtime.     Blood Glucose Monitoring Suppl (ONE TOUCH ULTRA 2) w/Device KIT Use to check blood sugar  0   estradiol  (ESTRACE ) 2 MG tablet Take 1 tablet (2 mg total) by mouth daily. 90 tablet 3   ibuprofen (ADVIL) 200 MG tablet Take 400 mg by mouth every 6 (six) hours as needed for moderate pain (pain score 4-6).     insulin glargine (LANTUS SOLOSTAR) 100 UNIT/ML Solostar Pen Inject 32 Units into the skin at bedtime.     Levonorgestrel (  LILETTA, 52 MG, IU) 1 Intra Uterine Device by Intrauterine route once.     Multiple Vitamin (MULTIVITAMIN WITH MINERALS) TABS tablet  Take 1 tablet by mouth daily.     nystatin (MYCOSTATIN/NYSTOP) powder Apply 1 Application topically daily as needed (irritation).     OZEMPIC, 1 MG/DOSE, 4 MG/3ML SOPN Inject 1 mg into the skin every Saturday.     valsartan-hydrochlorothiazide (DIOVAN-HCT) 320-25 MG tablet Take 1 tablet by mouth every evening.     No current facility-administered medications on file prior to visit.      Objective:     There were no vitals filed for this visit. Filed Weights   07/20/23 1427  Weight: 179 lb 4.8 oz (81.3 kg)               Abdomen: Soft.  Non-tender.  No masses.  No HSM.  Incision/s: Intact.  Healing well.  No erythema.  No drainage.   Abdomen: Soft.  Non-tender.  No masses.  No HSM.  Incision/s: Intact.  Healing well.  No erythema.  No drainage.    Pelvic:   Vulva: Normal appearance.  No lesions.  Vagina: No lesions or abnormalities noted.  Support: Normal pelvic support.  Urethra No masses tenderness or scarring.  Meatus Normal size without lesions or prolapse  Vag Cuff: Intact.  No lesions.  Anus: Normal exam.  No lesions.  Perineum: Normal exam.  No lesions.        Bimanual   Adnexae: No masses.  Non-tender to palpation.  Cuff: Negative for abnormality.            Assessment:    G4P3 Patient Active Problem List   Diagnosis Date Noted   Type 2 diabetes mellitus with hyperglycemia, with long-term current use of insulin (HCC) 12/16/2018     1. Postoperative state   2. Postmenopausal     Excellent recovery postop.   Plan:            1.  May resume normal activities with exception of heavy lifting.  I have asked her to refrain from intercourse for 2 weeks to give the cuff 2 more weeks to completely heal. Orders No orders of the defined types were placed in this encounter.   No orders of the defined types were placed in this encounter.     F/U  Return in about 3 months (around 10/20/2023).  Delice Felt, M.D. 07/20/2023 2:47 PM

## 2023-07-20 NOTE — Progress Notes (Signed)
 Patient presents for 6 week postop follow-up following LAVH/BSO. She states doing well, currently taking 2 mg Estrace  daily.

## 2024-03-01 IMAGING — CT CT ABD-PELV W/ CM
3 of 5 series · 16 of 46 positions shown, 18 images · IV contrast (APPLIED)
Comparison: None.

CLINICAL DATA: RLQ abdominal pain (Age >= 14y)

EXAM:
CT ABDOMEN AND PELVIS WITH CONTRAST
TECHNIQUE: Multidetector CT imaging of the abdomen and pelvis was performed
using the standard protocol following bolus administration of
intravenous contrast.

[Series 2: abdomen 5.0 · axial · 0.87mm/px · z∈[+954,+1354]mm · 11 of 98 slices shown, 13 images]
[im 9/98  soft-tissue]
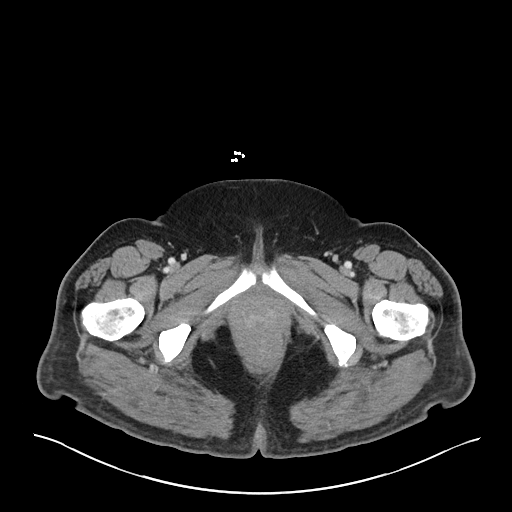
[im 9/98  bone]
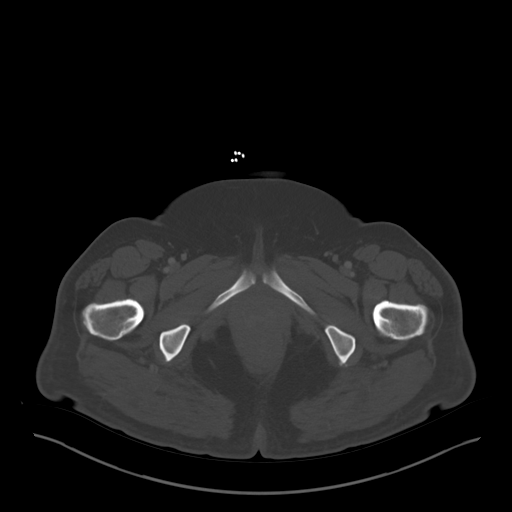
[im 17/98  soft-tissue]
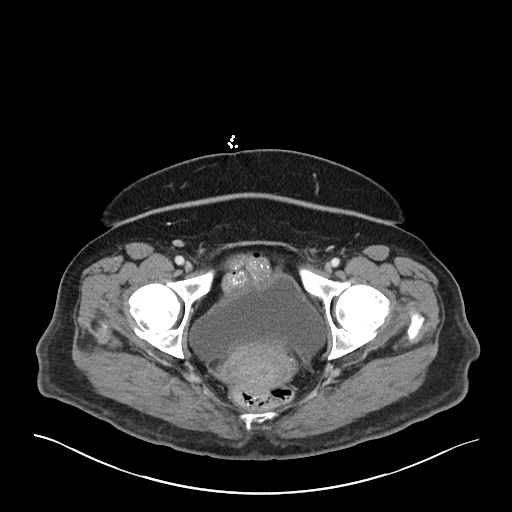
[im 25/98  soft-tissue]
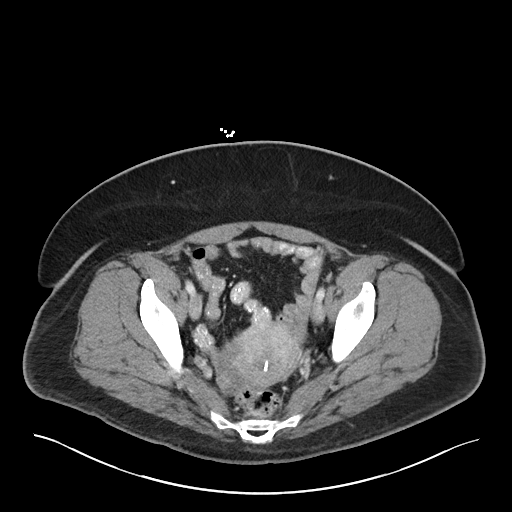
[im 33/98  soft-tissue]
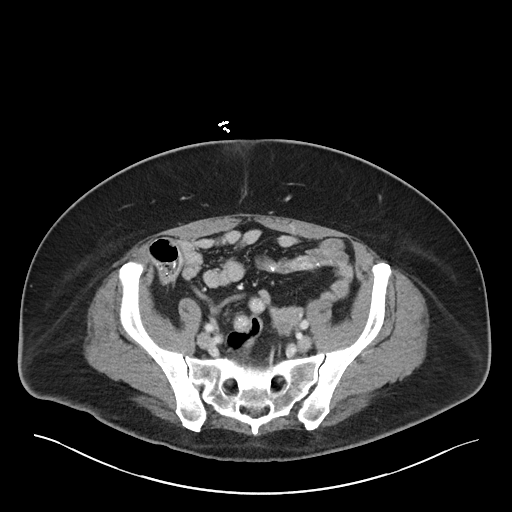
[im 41/98  soft-tissue]
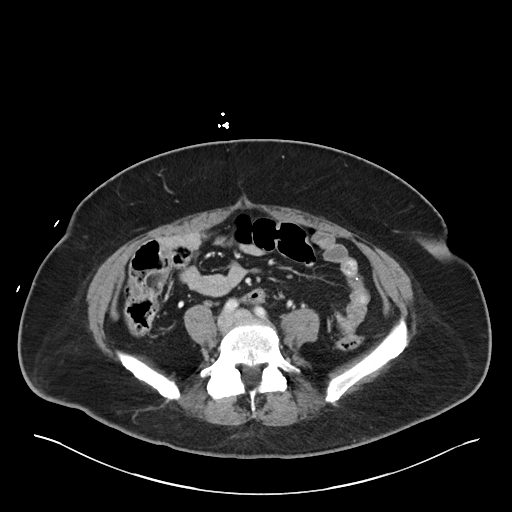
[im 49/98  soft-tissue]
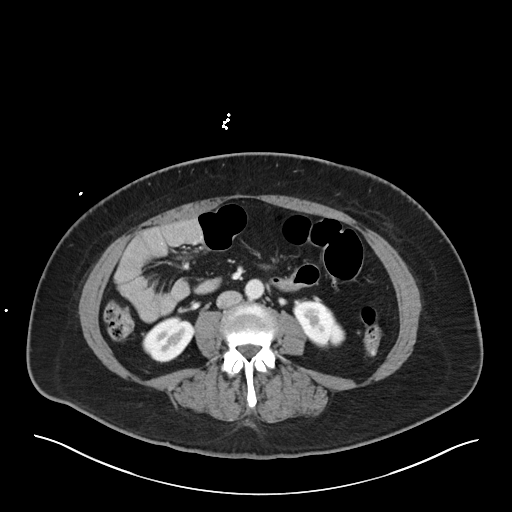
[im 57/98  soft-tissue]
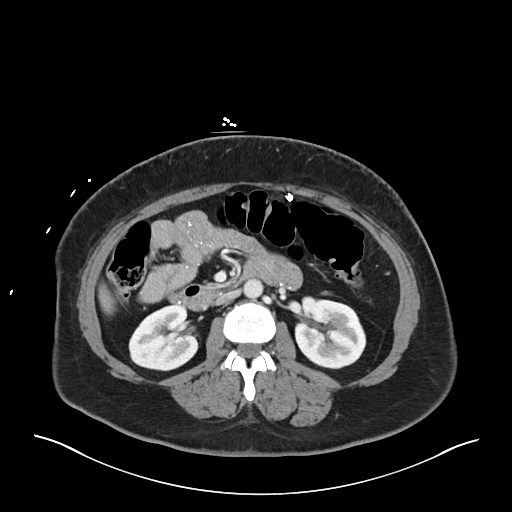
[im 65/98  soft-tissue]
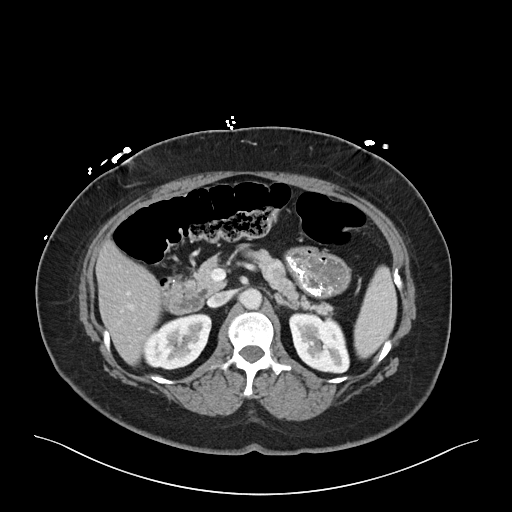
[im 73/98  soft-tissue]
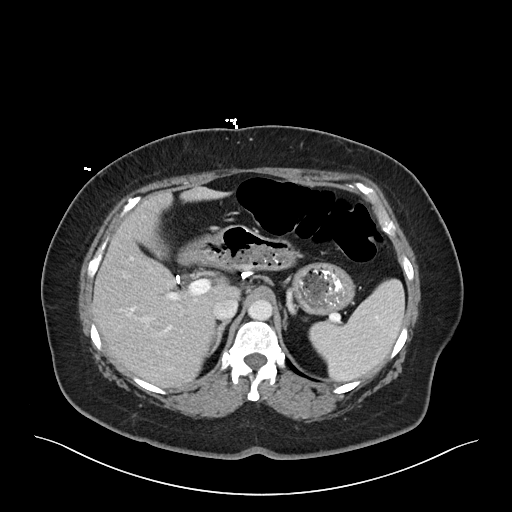
[im 73/98  bone]
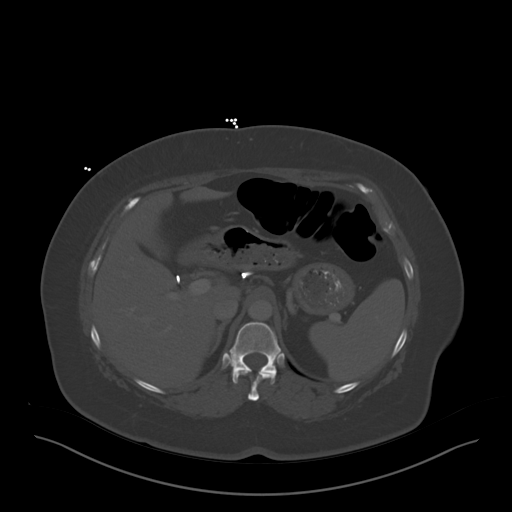
[im 81/98  soft-tissue]
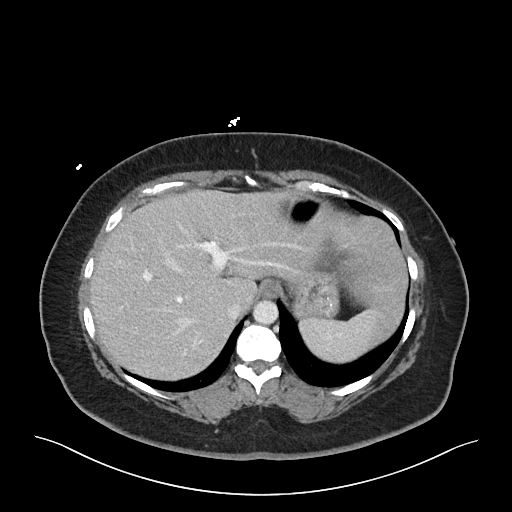
[im 89/98  soft-tissue]
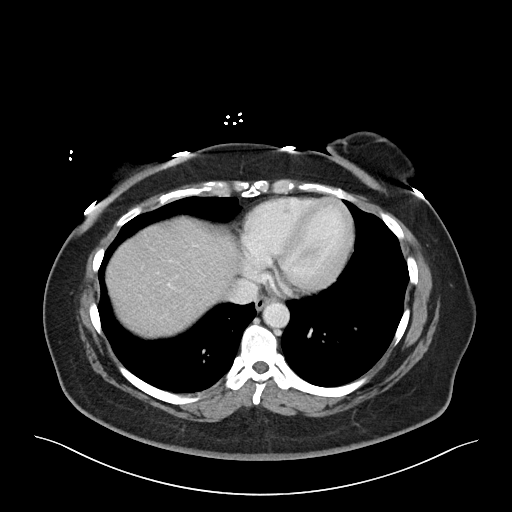

[Series 4: lung · axial · 0.67mm/px · z∈[+1252,+1266]mm · 2 of 82 slices shown]
[im 8/82  bone]
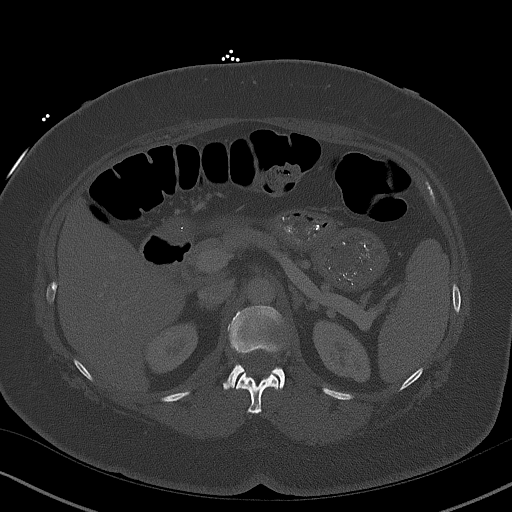
[im 15/82  bone]
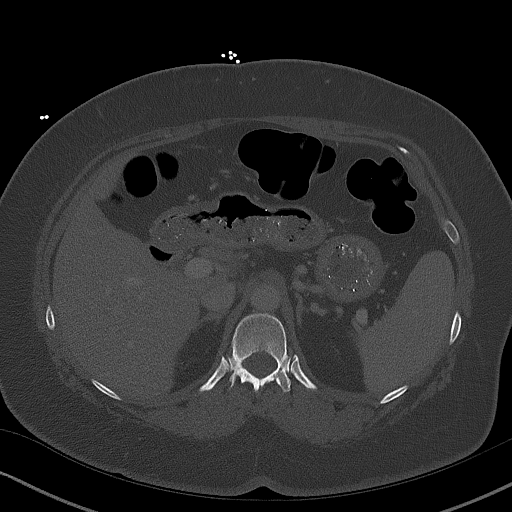

[Series 6: abdomen 3.0 mpr cor · coronal · 0.81mm/px · 3 of 102 slices shown]
[im 34/102  soft-tissue]
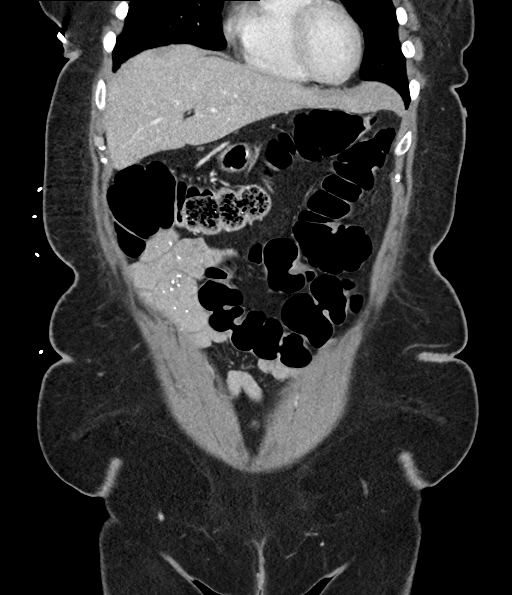
[im 45/102  soft-tissue]
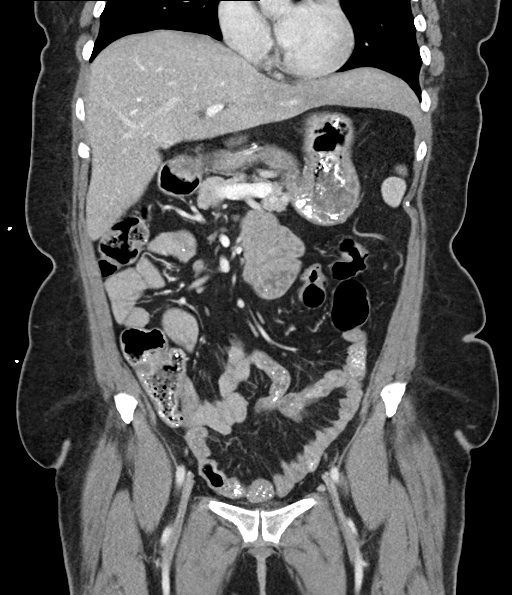
[im 57/102  soft-tissue]
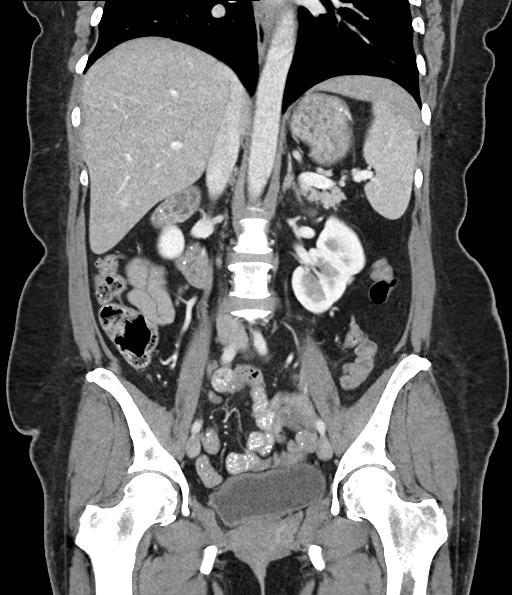

[16 of 46 positions shown; findings below may reference images not displayed]

RADIATION DOSE REDUCTION: This exam was performed according to the
departmental dose-optimization program which includes automated
exposure control, adjustment of the mA and/or kV according to
patient size and/or use of iterative reconstruction technique.

CONTRAST:  100mL OMNIPAQUE IOHEXOL 300 MG/ML  SOLN
FINDINGS: Lower chest: Clear lung bases. No acute airspace disease. No pleural
fluid.

Hepatobiliary: No focal liver abnormality is seen. Status post
cholecystectomy. No biliary dilatation.

Pancreas: No ductal dilatation or inflammation.

Spleen: Normal in size without focal abnormality.

Adrenals/Urinary Tract: Normal adrenal glands. No hydronephrosis or
perinephric edema. Homogeneous renal enhancement with symmetric
excretion on delayed phase imaging. No visualized renal calculi or
focal renal abnormality urinary bladder is partially distended
without wall thickening.

Stomach/Bowel: Normal appendix, for example axial series 2, image
66. No appendicitis. Unremarkable appearance of the stomach. There
is no small bowel obstruction or inflammation. The sigmoid colon is
redundant. Occasional descending and sigmoid colonic diverticula
without diverticulitis. There is no colonic inflammation or wall
thickening.

Vascular/Lymphatic: Normal caliber abdominal aorta. Circumaortic
left renal vein. Patent portal, splenic, and mesenteric veins. No
abdominopelvic adenopathy.

Reproductive: Intrauterine device appears appropriately position by
CT. Symmetric sized ovaries. No dominant cyst or adnexal mass.

Other: No ascites or free air. Tiny fat containing umbilical hernia.

Musculoskeletal: Lower lumbar facet hypertrophy. There are no acute
or suspicious osseous abnormalities.
IMPRESSION: 1. No acute abnormality or explanation for abdominal pain. Normal
appendix.
2. Minimal distal colonic diverticulosis without diverticulitis.
